# Patient Record
Sex: Female | Born: 1955 | Race: White | Hispanic: No | Smoking: Never smoker
Health system: Southern US, Community
[De-identification: ages and names within clinical notes are randomized; demographics above are authoritative.]

## PROBLEM LIST (undated history)

## (undated) DIAGNOSIS — G4730 Sleep apnea, unspecified: Secondary | ICD-10-CM

## (undated) DIAGNOSIS — M810 Age-related osteoporosis without current pathological fracture: Secondary | ICD-10-CM

## (undated) DIAGNOSIS — M1990 Unspecified osteoarthritis, unspecified site: Secondary | ICD-10-CM

## (undated) DIAGNOSIS — E785 Hyperlipidemia, unspecified: Secondary | ICD-10-CM

## (undated) DIAGNOSIS — H548 Legal blindness, as defined in USA: Secondary | ICD-10-CM

## (undated) DIAGNOSIS — D497 Neoplasm of unspecified behavior of endocrine glands and other parts of nervous system: Secondary | ICD-10-CM

## (undated) DIAGNOSIS — Z87442 Personal history of urinary calculi: Secondary | ICD-10-CM

## (undated) DIAGNOSIS — T8859XA Other complications of anesthesia, initial encounter: Secondary | ICD-10-CM

## (undated) DIAGNOSIS — J189 Pneumonia, unspecified organism: Secondary | ICD-10-CM

## (undated) DIAGNOSIS — G932 Benign intracranial hypertension: Secondary | ICD-10-CM

## (undated) DIAGNOSIS — R51 Headache: Secondary | ICD-10-CM

## (undated) DIAGNOSIS — I10 Essential (primary) hypertension: Secondary | ICD-10-CM

## (undated) DIAGNOSIS — R0602 Shortness of breath: Secondary | ICD-10-CM

## (undated) DIAGNOSIS — D649 Anemia, unspecified: Secondary | ICD-10-CM

## (undated) HISTORY — DX: Hyperlipidemia, unspecified: E78.5

## (undated) HISTORY — PX: EYE SURGERY: SHX253

## (undated) HISTORY — PX: ABDOMINAL HYSTERECTOMY: SHX81

## (undated) HISTORY — PX: CHOLECYSTECTOMY: SHX55

## (undated) HISTORY — PX: GASTRIC BYPASS: SHX52

## (undated) HISTORY — DX: Essential (primary) hypertension: I10

## (undated) HISTORY — PX: HERNIA REPAIR: SHX51

---

## 1998-06-29 ENCOUNTER — Encounter: Admission: RE | Admit: 1998-06-29 | Discharge: 1998-09-27 | Payer: Self-pay | Admitting: Family Medicine

## 2008-01-03 HISTORY — PX: JOINT REPLACEMENT: SHX530

## 2008-07-28 ENCOUNTER — Encounter: Payer: Self-pay | Admitting: Internal Medicine

## 2008-08-21 DIAGNOSIS — J40 Bronchitis, not specified as acute or chronic: Secondary | ICD-10-CM | POA: Insufficient documentation

## 2008-08-21 DIAGNOSIS — I498 Other specified cardiac arrhythmias: Secondary | ICD-10-CM | POA: Insufficient documentation

## 2008-08-24 ENCOUNTER — Ambulatory Visit: Payer: Self-pay | Admitting: Internal Medicine

## 2008-08-24 DIAGNOSIS — R0602 Shortness of breath: Secondary | ICD-10-CM | POA: Insufficient documentation

## 2008-08-24 DIAGNOSIS — I10 Essential (primary) hypertension: Secondary | ICD-10-CM | POA: Insufficient documentation

## 2008-08-24 DIAGNOSIS — R5383 Other fatigue: Secondary | ICD-10-CM

## 2008-08-24 DIAGNOSIS — R5381 Other malaise: Secondary | ICD-10-CM

## 2008-08-26 ENCOUNTER — Telehealth (INDEPENDENT_AMBULATORY_CARE_PROVIDER_SITE_OTHER): Payer: Self-pay

## 2008-08-27 ENCOUNTER — Ambulatory Visit: Payer: Self-pay | Admitting: Internal Medicine

## 2008-08-27 ENCOUNTER — Ambulatory Visit: Payer: Self-pay

## 2008-08-27 ENCOUNTER — Encounter: Payer: Self-pay | Admitting: Internal Medicine

## 2008-08-27 LAB — CONVERTED CEMR LAB: Free T4: 0.7 ng/dL (ref 0.6–1.6)

## 2008-08-28 LAB — CONVERTED CEMR LAB
Basophils Absolute: 0 10*3/uL (ref 0.0–0.1)
Calcium: 9 mg/dL (ref 8.4–10.5)
GFR calc non Af Amer: 93.29 mL/min (ref >60)
HCT: 40.6 % (ref 36.0–46.0)
HDL: 47.2 mg/dL (ref >39.00)
Lymphs Abs: 2 10*3/uL (ref 0.7–4.0)
Monocytes Absolute: 0.6 10*3/uL (ref 0.1–1.0)
Monocytes Relative: 8.8 % (ref 3.0–12.0)
Neutrophils Relative %: 58.4 % (ref 43.0–77.0)
Platelets: 230 10*3/uL (ref 150.0–400.0)
Potassium: 4.2 meq/L (ref 3.5–5.1)
RDW: 13 % (ref 11.5–14.6)
Sodium: 141 meq/L (ref 135–145)
Total CHOL/HDL Ratio: 4
Triglycerides: 103 mg/dL (ref 0.0–149.0)
VLDL: 20.6 mg/dL (ref 0.0–40.0)
WBC: 6.5 10*3/uL (ref 4.5–10.5)

## 2008-09-11 ENCOUNTER — Inpatient Hospital Stay (HOSPITAL_COMMUNITY): Admission: RE | Admit: 2008-09-11 | Discharge: 2008-09-15 | Payer: Self-pay | Admitting: Orthopedic Surgery

## 2009-07-01 ENCOUNTER — Encounter: Admission: RE | Admit: 2009-07-01 | Discharge: 2009-09-29 | Payer: Self-pay | Admitting: Orthopedic Surgery

## 2010-04-08 LAB — URINALYSIS, ROUTINE W REFLEX MICROSCOPIC
Ketones, ur: NEGATIVE mg/dL
Nitrite: NEGATIVE
Protein, ur: NEGATIVE mg/dL
Urobilinogen, UA: 0.2 mg/dL (ref 0.0–1.0)

## 2010-04-08 LAB — PROTIME-INR
INR: 0.9 (ref 0.00–1.49)
INR: 1.2 (ref 0.00–1.49)
INR: 2.4 — ABNORMAL HIGH (ref 0.00–1.49)
INR: 2.6 — ABNORMAL HIGH (ref 0.00–1.49)
Prothrombin Time: 25.9 seconds — ABNORMAL HIGH (ref 11.6–15.2)

## 2010-04-08 LAB — CBC
Hemoglobin: 10.6 g/dL — ABNORMAL LOW (ref 12.0–15.0)
Hemoglobin: 9.7 g/dL — ABNORMAL LOW (ref 12.0–15.0)
Platelets: 157 10*3/uL (ref 150–400)
Platelets: 236 10*3/uL (ref 150–400)
RBC: 3.19 MIL/uL — ABNORMAL LOW (ref 3.87–5.11)
RBC: 3.47 MIL/uL — ABNORMAL LOW (ref 3.87–5.11)
RDW: 13.9 % (ref 11.5–15.5)
RDW: 14.3 % (ref 11.5–15.5)
WBC: 10 10*3/uL (ref 4.0–10.5)

## 2010-04-08 LAB — BASIC METABOLIC PANEL
Calcium: 7.5 mg/dL — ABNORMAL LOW (ref 8.4–10.5)
Calcium: 7.8 mg/dL — ABNORMAL LOW (ref 8.4–10.5)
Creatinine, Ser: 0.73 mg/dL (ref 0.4–1.2)
GFR calc Af Amer: >60 mL/min (ref >60)
GFR calc non Af Amer: >60 mL/min (ref >60)
GFR calc non Af Amer: >60 mL/min (ref >60)
Glucose, Bld: 136 mg/dL — ABNORMAL HIGH (ref 70–99)
Sodium: 134 mEq/L — ABNORMAL LOW (ref 135–145)

## 2010-04-08 LAB — TYPE AND SCREEN: Antibody Screen: NEGATIVE

## 2010-04-08 LAB — URINALYSIS, MICROSCOPIC ONLY
Bilirubin Urine: NEGATIVE
Glucose, UA: NEGATIVE mg/dL
Hgb urine dipstick: NEGATIVE
Protein, ur: NEGATIVE mg/dL
Specific Gravity, Urine: 1.012 (ref 1.005–1.030)
Urobilinogen, UA: 1 mg/dL (ref 0.0–1.0)

## 2010-04-08 LAB — COMPREHENSIVE METABOLIC PANEL
ALT: 18 U/L (ref 0–35)
AST: 18 U/L (ref 0–37)
Albumin: 3.6 g/dL (ref 3.5–5.2)
Alkaline Phosphatase: 64 U/L (ref 39–117)
BUN: 17 mg/dL (ref 6–23)
GFR calc Af Amer: >60 mL/min (ref >60)
Potassium: 3.7 mEq/L (ref 3.5–5.1)
Sodium: 140 mEq/L (ref 135–145)
Total Protein: 6.9 g/dL (ref 6.0–8.3)

## 2010-04-08 LAB — URINE CULTURE: Colony Count: >=100000

## 2012-05-08 ENCOUNTER — Telehealth: Payer: Self-pay | Admitting: Nurse Practitioner

## 2012-05-08 NOTE — Telephone Encounter (Signed)
PATIENT WANTED AN APPT FOR 5/15 FOR RIGHT SIDE PAIN APPT WAS GIVEN BUT PATIENT WAS TOLD AND UNDERSTANDS THAT IF SHE STARTS HAVING ANY FEVER, VOMITING OR IF THE PAIN GOT WORSE SHE NEEDED TO GET TO THE ER. THE 15TH IS WHEN THE PATIENT WANTED TO BE SEEN.

## 2012-05-16 ENCOUNTER — Ambulatory Visit (INDEPENDENT_AMBULATORY_CARE_PROVIDER_SITE_OTHER): Payer: Medicare Other | Admitting: Nurse Practitioner

## 2012-05-16 ENCOUNTER — Ambulatory Visit (INDEPENDENT_AMBULATORY_CARE_PROVIDER_SITE_OTHER): Payer: Medicare Other

## 2012-05-16 ENCOUNTER — Encounter: Payer: Self-pay | Admitting: Nurse Practitioner

## 2012-05-16 VITALS — BP 121/80 | HR 78 | Temp 98.4°F | Wt 237.0 lb

## 2012-05-16 DIAGNOSIS — R5381 Other malaise: Secondary | ICD-10-CM

## 2012-05-16 DIAGNOSIS — I10 Essential (primary) hypertension: Secondary | ICD-10-CM

## 2012-05-16 DIAGNOSIS — R109 Unspecified abdominal pain: Secondary | ICD-10-CM

## 2012-05-16 LAB — COMPLETE METABOLIC PANEL WITH GFR
Albumin: 4 g/dL (ref 3.5–5.2)
Alkaline Phosphatase: 65 U/L (ref 39–117)
BUN: 18 mg/dL (ref 6–23)
GFR, Est Non African American: 85 mL/min
Glucose, Bld: 99 mg/dL (ref 70–99)
Total Bilirubin: 0.5 mg/dL (ref 0.3–1.2)

## 2012-05-16 LAB — THYROID PANEL WITH TSH
Free Thyroxine Index: 2.7 (ref 1.0–3.9)
T3 Uptake: 33.6 % (ref 22.5–37.0)
T4, Total: 7.9 ug/dL (ref 5.0–12.5)

## 2012-05-16 LAB — POCT UA - MICROSCOPIC ONLY
Crystals, Ur, HPF, POC: NEGATIVE
Mucus, UA: NEGATIVE

## 2012-05-16 LAB — POCT URINALYSIS DIPSTICK
Bilirubin, UA: NEGATIVE
Glucose, UA: NEGATIVE
Spec Grav, UA: 1.02

## 2012-05-16 LAB — ANEMIA PANEL 7
%SAT: 28 % (ref 20–55)
Iron: 89 ug/dL (ref 42–145)

## 2012-05-16 NOTE — Progress Notes (Signed)
Subjective:    Patient ID: Rhonda Ruiz, female    DOB: 07-18-1955, 57 y.o.   MRN: [id number]  HPI Patient here today C/O weakness and fatigue. Patient said was having right flank pain fro a week when she called to make appointment.Noe pain has completely resolved. Patient says that she is having trouble sleeping at night and falls asleep during the day if she gets still. She say sthis has been going on for over a year. Patient says that she does snore at night.    Review of Systems  Constitutional: Positive for fatigue and unexpected weight change (gain). Negative for fever, diaphoresis and appetite change.  Respiratory: Negative for cough, shortness of breath and stridor.   Cardiovascular: Negative for chest pain, palpitations and leg swelling.  Gastrointestinal: Positive for diarrhea (since had Gallbladder out.).  Musculoskeletal: Negative.   Skin: Negative.   Hematological: Negative.   Psychiatric/Behavioral: Negative.        Objective:   Physical Exam  Constitutional: She is oriented to person, place, and time. She appears well-developed and well-nourished.  Obese   HENT:  Right Ear: External ear normal.  Left Ear: External ear normal.  Nose: Nose normal.  Mouth/Throat: Oropharynx is clear and moist.  Eyes: EOM are normal. Pupils are equal, round, and reactive to light.  Neck: Normal range of motion. Neck supple.  Cardiovascular: Normal rate, regular rhythm and normal heart sounds.   Pulmonary/Chest: Effort normal and breath sounds normal.  Abdominal: Soft. Bowel sounds are normal.  Musculoskeletal: Normal range of motion.  Lymphadenopathy:    She has no cervical adenopathy.  Neurological: She is alert and oriented to person, place, and time. She has normal reflexes.  Skin: Skin is warm.  Psychiatric: She has a normal mood and affect. Her behavior is normal. Thought content normal.   BP 121/80  Pulse 78  Temp(Src) 98.4 F (36.9 C) (Oral)  Wt 237 lb (107.502 kg)   BMI 49.55 kg/m2 KUB-No acute findings-Preliminary reading by Paulene Floor, FNP  [facility]  Results for orders placed in visit on 05/16/12  POCT URINALYSIS DIPSTICK      Result Value Range   Color, UA gold     Clarity, UA clear     Glucose, UA neg     Bilirubin, UA neg     Ketones, UA neg     Spec Grav, UA 1.020     Blood, UA trace     pH, UA 6.0     Protein, UA neg     Urobilinogen, UA negative     Nitrite, UA neg     Leukocytes, UA Trace    POCT UA - MICROSCOPIC ONLY      Result Value Range   WBC, Ur, HPF, POC 10-15     RBC, urine, microscopic 1-5     Bacteria, U Microscopic mod     Mucus, UA neg     Epithelial cells, urine per micros few     Crystals, Ur, HPF, POC neg     Casts, Ur, LPF, POC neg     Yeast, UA neg           Assessment & Plan:  1. Side pain Keep diary of episodes - POCT urinalysis dipstick - POCT UA - Microscopic Only - DG Abd 1 View; Future  2. Other malaise and fatigue Labs pending Rest - Anemia panel 7 - Ambulatory referral to Sleep Studies - Thyroid Panel With TSH  3. Hypertension Low Na+ diet Low  fat diet and exercise - COMPLETE METABOLIC PANEL WITH GFR  Mary-Margaret Daphine Deutscher, FNP  - NMR Lipoprofile with Lipids

## 2012-05-16 NOTE — Patient Instructions (Signed)
Sleep Apnea  Sleep apnea is a sleep disorder characterized by abnormal pauses in breathing while you sleep. When your breathing pauses, the level of oxygen in your blood decreases. This causes you to move out of deep sleep and into light sleep. As a result, your quality of sleep is poor, and the system that carries your blood throughout your body (cardiovascular system) experiences stress. If sleep apnea remains untreated, the following conditions can develop:  High blood pressure (hypertension).  Coronary artery disease.  Inability to achieve or maintain an erection (impotence).  Impairment of your thought process (cognitive dysfunction). There are three types of sleep apnea: 1. Obstructive sleep apnea Pauses in breathing during sleep because of a blocked airway. 2. Central sleep apnea Pauses in breathing during sleep because the area of the brain that controls your breathing does not send the correct signals to the muscles that control breathing. 3. Mixed sleep apnea A combination of both obstructive and central sleep apnea. RISK FACTORS The following risk factors can increase your risk of developing sleep apnea:  Being overweight.  Smoking.  Having narrow passages in your nose and throat.  Being of older age.  Being female.  Alcohol use.  Sedative and tranquilizer use.  Ethnicity. Among individuals younger than 35 years, African Americans are at increased risk of sleep apnea. SYMPTOMS   Difficulty staying asleep.  Daytime sleepiness and fatigue.  Loss of energy.  Irritability.  Loud, heavy snoring.  Morning headaches.  Trouble concentrating.  Forgetfulness.  Decreased interest in sex. DIAGNOSIS  In order to diagnose sleep apnea, your caregiver will perform a physical examination. Your caregiver may suggest that you take a home sleep test. Your caregiver may also recommend that you spend the night in a sleep lab. In the sleep lab, several monitors record  information about your heart, lungs, and brain while you sleep. Your leg and arm movements and blood oxygen level are also recorded. TREATMENT The following actions may help to resolve mild sleep apnea:  Sleeping on your side.   Using a decongestant if you have nasal congestion.   Avoiding the use of depressants, including alcohol, sedatives, and narcotics.   Losing weight and modifying your diet if you are overweight. There also are devices and treatments to help open your airway:  Oral appliances. These are custom-made mouthpieces that shift your lower jaw forward and slightly open your bite. This opens your airway.  Devices that create positive airway pressure. This positive pressure "splints" your airway open to help you breathe better during sleep. The following devices create positive airway pressure:  Continuous positive airway pressure (CPAP) device. The CPAP device creates a continuous level of air pressure with an air pump. The air is delivered to your airway through a mask while you sleep. This continuous pressure keeps your airway open.  Nasal expiratory positive airway pressure (EPAP) device. The EPAP device creates positive air pressure as you exhale. The device consists of single-use valves, which are inserted into each nostril and held in place by adhesive. The valves create very little resistance when you inhale but create much more resistance when you exhale. That increased resistance creates the positive airway pressure. This positive pressure while you exhale keeps your airway open, making it easier to breath when you inhale again.  Bilevel positive airway pressure (BPAP) device. The BPAP device is used mainly in patients with central sleep apnea. This device is similar to the CPAP device because it also uses an air pump to deliver   continuous air pressure through a mask. However, with the BPAP machine, the pressure is set at two different levels. The pressure when you  exhale is lower than the pressure when you inhale.  Surgery. Typically, surgery is only done if you cannot comply with less invasive treatments or if the less invasive treatments do not improve your condition. Surgery involves removing excess tissue in your airway to create a wider passage way. Document Released: 12/09/2001 Document Revised: 06/20/2011 Document Reviewed: 04/27/2011 ExitCare Patient Information 2013 [employer].  

## 2012-05-20 LAB — NMR LIPOPROFILE WITH LIPIDS
Cholesterol, Total: 203 mg/dL — ABNORMAL HIGH (ref <200)
LDL Particle Number: 2021 nmol/L — ABNORMAL HIGH (ref <1000)
LP-IR Score: 59 — ABNORMAL HIGH (ref <=45)
Large VLDL-P: 2.6 nmol/L (ref <=2.7)
Triglycerides: 175 mg/dL — ABNORMAL HIGH (ref <150)
VLDL Size: 44.8 nm (ref <=46.6)

## 2012-05-21 ENCOUNTER — Telehealth: Payer: Self-pay

## 2012-05-21 ENCOUNTER — Other Ambulatory Visit: Payer: Self-pay | Admitting: Nurse Practitioner

## 2012-05-21 ENCOUNTER — Other Ambulatory Visit: Payer: Self-pay

## 2012-05-21 DIAGNOSIS — G4730 Sleep apnea, unspecified: Secondary | ICD-10-CM

## 2012-05-21 MED ORDER — ATORVASTATIN CALCIUM 40 MG PO TABS: 40.0000 mg | ORAL_TABLET | Freq: Every day | ORAL | Status: DC

## 2012-05-21 NOTE — Telephone Encounter (Signed)
Returning your call. °

## 2012-05-29 ENCOUNTER — Ambulatory Visit: Payer: Medicare Other | Attending: Family Medicine | Admitting: Sleep Medicine

## 2012-05-29 VITALS — Ht 59.0 in | Wt 238.0 lb

## 2012-05-29 DIAGNOSIS — Z6841 Body Mass Index (BMI) 40.0 and over, adult: Secondary | ICD-10-CM | POA: Insufficient documentation

## 2012-05-29 DIAGNOSIS — G4710 Hypersomnia, unspecified: Secondary | ICD-10-CM

## 2012-05-29 DIAGNOSIS — G4733 Obstructive sleep apnea (adult) (pediatric): Secondary | ICD-10-CM | POA: Insufficient documentation

## 2012-06-04 NOTE — Procedures (Signed)
[facility] Tam Delisle A. Gerilyn Pilgrim, MD     [email or web address]        NAME:  Rhonda Ruiz, Rhonda Ruiz                ACCOUNT NO.:  [id number]  MEDICAL RECORD NO.:  [id number]          PATIENT TYPE:  OUT  LOCATION:  SLEEP LAB                     FACILITY:  [facility]  PHYSICIAN:  Terry Abila A. Gerilyn Pilgrim, M.D. DATE OF BIRTH:  February 29, 1955  DATE OF STUDY:  05/29/2012                           NOCTURNAL POLYSOMNOGRAM  REFERRING PHYSICIAN:  MARY-MARGARET MARTIN  INDICATION FOR STUDY:  This is a 57 year old, who presents with snoring loud and difficulty concentrating and daytime sleepiness.  EPWORTH SLEEPINESS SCORE:  14.  BMI:  48.  MEDICATIONS:  Acetazolamide, Lipitor.  SLEEP ARCHITECTURE:  This is a split night recording with initial portion being a diagnostic and the second portion a titration recording. The total recording time is 383 minutes.  Sleep efficiency 80%, sleep latency 10 minutes.  REM latency 173 minutes.  RESPIRATORY DATA:  Baseline oxygen saturation is 98, lowest saturation 89 during non-REM sleep.  Diagnostic AHI is 45.  The patient was placed on positive pressure between 5 and 8.  Optimal pressure is 8 with resolution of obstructive events and with good tolerance.  CARDIAC DATA:  Average heart rate is 83 with no significant dysrhythmias observed.  MOVEMENT-PARASOMNIA:  PLM index 0.  IMPRESSIONS-RECOMMENDATIONS:  Severe obstructive sleep apnea syndrome, which responds well to a CPAP of 8.  Thanks for this referral.    Gerard Cantara A. Gerilyn Pilgrim, M.D.   KAD/MEDQ  D:  06/04/2012 08:57:04  T:  06/04/2012 21:53:50  Job:  [id number]

## 2012-06-10 ENCOUNTER — Telehealth: Payer: Self-pay | Admitting: Nurse Practitioner

## 2012-06-10 ENCOUNTER — Other Ambulatory Visit: Payer: Self-pay | Admitting: Nurse Practitioner

## 2012-06-10 MED ORDER — SIMVASTATIN 40 MG PO TABS: 40.0000 mg | ORAL_TABLET | Freq: Every day | ORAL | Status: DC

## 2012-06-10 NOTE — Telephone Encounter (Signed)
Severe sleep apnea- Needs CPAP- Sleep study place should be ordering for her Changed lipitor to zocor

## 2012-06-10 NOTE — Telephone Encounter (Signed)
This is the one about the cpap machine

## 2012-06-11 NOTE — Telephone Encounter (Signed)
PLEASE TAKE CARE OF THANKS  

## 2012-06-12 ENCOUNTER — Other Ambulatory Visit: Payer: Self-pay | Admitting: Nurse Practitioner

## 2012-06-12 DIAGNOSIS — G4733 Obstructive sleep apnea (adult) (pediatric): Secondary | ICD-10-CM

## 2012-06-12 NOTE — Telephone Encounter (Signed)
I need an order put in EPIc for CPAP, with the pressure rate, will probably print out and then get to me

## 2012-06-12 NOTE — Telephone Encounter (Signed)
This was addressed yesterday- zocor called in and sleep study show ed severe sleep apnea nad they should be calling her for CPAP

## 2012-06-12 NOTE — Telephone Encounter (Signed)
Please see Gina's note.

## 2012-06-12 NOTE — Progress Notes (Signed)
Pt aware.

## 2012-07-19 ENCOUNTER — Telehealth: Payer: Self-pay | Admitting: Nurse Practitioner

## 2012-07-19 NOTE — Telephone Encounter (Signed)
Only one can do is welchol wich is 3 pils two times a day- why can't take statin?

## 2012-07-22 MED ORDER — COLESEVELAM HCL 625 MG PO TABS: 1875.0000 mg | ORAL_TABLET | Freq: Two times a day (BID) | ORAL | Status: DC

## 2012-07-22 NOTE — Telephone Encounter (Signed)
The statins make her joints start hurting. Everything started to ache. Pt is willing to try welchol.

## 2012-10-03 ENCOUNTER — Ambulatory Visit (INDEPENDENT_AMBULATORY_CARE_PROVIDER_SITE_OTHER): Payer: Medicare Other

## 2012-10-03 ENCOUNTER — Other Ambulatory Visit: Payer: Self-pay | Admitting: Orthopedic Surgery

## 2012-10-03 DIAGNOSIS — M25519 Pain in unspecified shoulder: Secondary | ICD-10-CM

## 2012-10-03 DIAGNOSIS — M25569 Pain in unspecified knee: Secondary | ICD-10-CM

## 2012-10-03 DIAGNOSIS — R52 Pain, unspecified: Secondary | ICD-10-CM

## 2012-10-10 ENCOUNTER — Encounter (INDEPENDENT_AMBULATORY_CARE_PROVIDER_SITE_OTHER): Payer: Self-pay

## 2012-10-10 ENCOUNTER — Encounter: Payer: Medicare Other | Admitting: Family Medicine

## 2012-10-15 ENCOUNTER — Telehealth: Payer: Self-pay | Admitting: Nurse Practitioner

## 2012-10-18 ENCOUNTER — Other Ambulatory Visit: Payer: Self-pay | Admitting: General Practice

## 2012-10-21 ENCOUNTER — Other Ambulatory Visit: Payer: Self-pay | Admitting: General Practice

## 2012-10-21 DIAGNOSIS — E782 Mixed hyperlipidemia: Secondary | ICD-10-CM

## 2012-10-21 MED ORDER — GEMFIBROZIL 600 MG PO TABS: 600.0000 mg | ORAL_TABLET | Freq: Two times a day (BID) | ORAL | Status: DC

## 2012-10-21 NOTE — Telephone Encounter (Signed)
Spoke with pt and will sch appt.

## 2012-10-21 NOTE — Telephone Encounter (Signed)
Patient aware that script is ready for pick up. Lopid ordered and welchol discontinued per patient request.

## 2012-10-21 NOTE — Telephone Encounter (Signed)
NTBS for labs prior to changing meds

## 2012-10-22 ENCOUNTER — Telehealth: Payer: Self-pay | Admitting: *Deleted

## 2012-10-22 NOTE — Telephone Encounter (Signed)
Pt aware, gemfibrozil rx ready.

## 2013-01-17 NOTE — Progress Notes (Signed)
Patient ID: Rhonda Ruiz, female   DOB: March 09, 1955, 58 y.o.   MRN: [id number]

## 2013-03-24 ENCOUNTER — Ambulatory Visit: Payer: Medicare Other | Admitting: Physical Therapy

## 2013-03-26 ENCOUNTER — Ambulatory Visit: Payer: Medicare Other | Attending: Orthopedic Surgery | Admitting: Physical Therapy

## 2013-03-26 DIAGNOSIS — M25619 Stiffness of unspecified shoulder, not elsewhere classified: Secondary | ICD-10-CM | POA: Insufficient documentation

## 2013-03-26 DIAGNOSIS — R5381 Other malaise: Secondary | ICD-10-CM | POA: Insufficient documentation

## 2013-03-26 DIAGNOSIS — M25519 Pain in unspecified shoulder: Secondary | ICD-10-CM | POA: Insufficient documentation

## 2013-04-02 ENCOUNTER — Ambulatory Visit: Payer: Medicare Other | Attending: Orthopedic Surgery | Admitting: Physical Therapy

## 2013-04-02 DIAGNOSIS — M25519 Pain in unspecified shoulder: Secondary | ICD-10-CM | POA: Insufficient documentation

## 2013-04-02 DIAGNOSIS — M25619 Stiffness of unspecified shoulder, not elsewhere classified: Secondary | ICD-10-CM | POA: Insufficient documentation

## 2013-04-02 DIAGNOSIS — R5381 Other malaise: Secondary | ICD-10-CM | POA: Insufficient documentation

## 2013-05-15 ENCOUNTER — Encounter: Payer: Self-pay | Admitting: Nurse Practitioner

## 2013-05-15 ENCOUNTER — Ambulatory Visit (INDEPENDENT_AMBULATORY_CARE_PROVIDER_SITE_OTHER): Payer: Medicare Other

## 2013-05-15 ENCOUNTER — Ambulatory Visit (INDEPENDENT_AMBULATORY_CARE_PROVIDER_SITE_OTHER): Payer: Medicare Other | Admitting: Nurse Practitioner

## 2013-05-15 VITALS — BP 98/62 | Temp 98.0°F | Ht 59.0 in | Wt 243.0 lb

## 2013-05-15 DIAGNOSIS — Z23 Encounter for immunization: Secondary | ICD-10-CM

## 2013-05-15 DIAGNOSIS — Z01818 Encounter for other preprocedural examination: Secondary | ICD-10-CM

## 2013-05-15 NOTE — Addendum Note (Signed)
Addended by: Josephine Cables on: 05/15/2013 04:44 PM   Modules accepted: Orders

## 2013-05-15 NOTE — Progress Notes (Signed)
   Subjective:    Patient ID: Rhonda Ruiz, female    DOB: 28-Jun-1955, 58 y.o.   MRN: [id number]  HPI  Patient is getting ready to have left shoulder surgery- SHe is here today for surgical clearance- SHe has no complaints other than her shoulder pain today.    Review of Systems  Constitutional: Negative.   HENT: Negative.   Respiratory: Negative.   Cardiovascular: Negative.   Genitourinary: Negative.   Neurological: Negative.   Psychiatric/Behavioral: Negative.   All other systems reviewed and are negative.      Objective:   Physical Exam  Constitutional: She is oriented to person, place, and time. She appears well-developed and well-nourished.  Cardiovascular: Normal rate, regular rhythm and normal heart sounds.   Pulmonary/Chest: Effort normal and breath sounds normal.  Neurological: She is alert and oriented to person, place, and time.  Skin: Skin is warm and dry.  Psychiatric: She has a normal mood and affect. Her behavior is normal. Judgment and thought content normal.   BP 98/62  Temp(Src) 98 F (36.7 C) (Oral)  Ht 4\' 11"  (1.499 m)  Wt 243 lb (110.224 kg)  BMI 49.05 kg/m2  Chest x ray- negative-Preliminary reading by Ronnald Collum, FNP  [facility]  EKG- NSR with occasional PVC-Rhonda Hassell Done, FNP          Assessment & Plan:   1. Preoperative clearance    Cleared for surgery Will need lab work just prior to surgery  [place], FNP

## 2013-05-22 ENCOUNTER — Encounter: Payer: Self-pay | Admitting: Physician Assistant

## 2013-05-22 ENCOUNTER — Ambulatory Visit (INDEPENDENT_AMBULATORY_CARE_PROVIDER_SITE_OTHER): Payer: Medicare Other | Admitting: Physician Assistant

## 2013-05-22 ENCOUNTER — Encounter (HOSPITAL_COMMUNITY): Payer: Self-pay | Admitting: Pharmacy Technician

## 2013-05-22 VITALS — BP 136/80 | HR 75 | Temp 98.3°F | Ht 59.0 in | Wt 243.0 lb

## 2013-05-22 DIAGNOSIS — L0390 Cellulitis, unspecified: Secondary | ICD-10-CM

## 2013-05-22 DIAGNOSIS — L0291 Cutaneous abscess, unspecified: Secondary | ICD-10-CM

## 2013-05-22 MED ORDER — CEPHALEXIN 500 MG PO CAPS: 500.0000 mg | ORAL_CAPSULE | Freq: Two times a day (BID) | ORAL | Status: DC

## 2013-05-22 NOTE — Progress Notes (Signed)
Subjective:     Patient ID: Rhonda Ruiz, female   DOB: [birth year], 58 y.o.   MRN: [id number]  HPI Pain and redness to site of tetanus injection from 1 week ago Concerned due to upcoming joint replacement next wk  Review of Systems + pain and redness to the R shoulder Denies any drainage from the site No hx of rx to injection    Objective:   Physical Exam + erythema, edema, and induration to the lateral R shoulder Sl TTP No drainage noted FROM of the shoulder    Assessment:     Cellulitis    Plan:     Pt is scheduled for L shoulder replacement next week Will tx with Keflex 500mg  1 po bid # 20] Warm compresses F/U prn

## 2013-05-22 NOTE — Patient Instructions (Signed)
Cellulitis Cellulitis is an infection of the skin and the tissue beneath it. The infected area is usually red and tender. Cellulitis occurs most often in the arms and lower legs.  CAUSES  Cellulitis is caused by bacteria that enter the skin through cracks or cuts in the skin. The most common types of bacteria that cause cellulitis are Staphylococcus and Streptococcus. SYMPTOMS   Redness and warmth.  Swelling.  Tenderness or pain.  Fever. DIAGNOSIS  Your caregiver can usually determine what is wrong based on a physical exam. Blood tests may also be done. TREATMENT  Treatment usually involves taking an antibiotic medicine. HOME CARE INSTRUCTIONS   Take your antibiotics as directed. Finish them even if you start to feel better.  Keep the infected arm or leg elevated to reduce swelling.  Apply a warm cloth to the affected area up to 4 times per day to relieve pain.  Only take over-the-counter or prescription medicines for pain, discomfort, or fever as directed by your caregiver.  Keep all follow-up appointments as directed by your caregiver. SEEK MEDICAL CARE IF:   You notice red streaks coming from the infected area.  Your red area gets larger or turns dark in color.  Your bone or joint underneath the infected area becomes painful after the skin has healed.  Your infection returns in the same area or another area.  You notice a swollen bump in the infected area.  You develop new symptoms. SEEK IMMEDIATE MEDICAL CARE IF:   You have a fever.  You feel very sleepy.  You develop vomiting or diarrhea.  You have a general ill feeling (malaise) with muscle aches and pains. MAKE SURE YOU:   Understand these instructions.  Will watch your condition.  Will get help right away if you are not doing well or get worse. Document Released: 09/28/2004 Document Revised: 06/20/2011 Document Reviewed: 03/06/2011 ExitCare Patient Information 2014 [employer].  

## 2013-05-27 NOTE — Pre-Procedure Instructions (Addendum)
Rhonda Ruiz  05/27/2013   Your procedure is scheduled on:  05/30/13  Report to [facility] short stay admitting at 78 AM.  Call this number if you have problems the morning of surgery: [phone]   Remember:   Do not eat food or drink liquids after midnight.   Take these medicines the morning of surgery with A SIP OF WATER:       Take all meds as ordered until day of surgery except as instructed below or per dr      Bridgette Habermann all herbel meds, nsaids (aleve,naproxen,advil,ibuprofen) including vitamins, aspirin   Do not wear jewelry, make-up or nail polish.  Do not wear lotions, powders, or perfumes. You may wear deodorant.  Do not shave 48 hours prior to surgery. Men may shave face and neck.  Do not bring valuables to the hospital.  [facility] is not responsible                  for any belongings or valuables.               Contacts, dentures or bridgework may not be worn into surgery.  Leave suitcase in the car. After surgery it may be brought to your room.  For patients admitted to the hospital, discharge time is determined by your                treatment team.               Patients discharged the day of surgery will not be allowed to drive  home.  Name and phone number of your driver:   Special Instructions:  Special Instructions: [facility] - Preparing for Surgery  Before surgery, you can play an important role.  Because skin is not sterile, your skin needs to be as free of germs as possible.  You can reduce the number of germs on you skin by washing with CHG (chlorahexidine gluconate) soap before surgery.  CHG is an antiseptic cleaner which kills germs and bonds with the skin to continue killing germs even after washing.  Please DO NOT use if you have an allergy to CHG or antibacterial soaps.  If your skin becomes reddened/irritated stop using the CHG and inform your nurse when you arrive at Short Stay.  Do not shave (including legs and underarms) for at least 48 hours prior  to the first CHG shower.  You may shave your face.  Please follow these instructions carefully:   1.  Shower with CHG Soap the night before surgery and the morning of Surgery.  2.  If you choose to wash your hair, wash your hair first as usual with your normal shampoo.  3.  After you shampoo, rinse your hair and body thoroughly to remove the Shampoo.  4.  Use CHG as you would any other liquid soap.  You can apply chg directly  to the skin and wash gently with scrungie or a clean washcloth.  5.  Apply the CHG Soap to your body ONLY FROM THE NECK DOWN.  Do not use on open wounds or open sores.  Avoid contact with your eyes ears, mouth and genitals (private parts).  Wash genitals (private parts)       with your normal soap.  6.  Wash thoroughly, paying special attention to the area where your surgery will be performed.  7.  Thoroughly rinse your body with warm water from the neck down.  8.  DO NOT shower/wash  with your normal soap after using and rinsing off the CHG Soap.  9.  Pat yourself dry with a clean towel.            10.  Wear clean pajamas.            11.  Place clean sheets on your bed the night of your first shower and do not sleep with pets.  Day of Surgery  Do not apply any lotions/deodorants the morning of surgery.  Please wear clean clothes to the hospital/surgery center.   Please read over the following fact sheets that you were given: Pain Booklet, Coughing and Deep Breathing, Blood Transfusion Information and Surgical Site Infection Prevention

## 2013-05-28 ENCOUNTER — Encounter (HOSPITAL_COMMUNITY): Payer: Self-pay

## 2013-05-28 ENCOUNTER — Encounter (HOSPITAL_COMMUNITY)
Admission: RE | Admit: 2013-05-28 | Discharge: 2013-05-28 | Disposition: A | Discharge status: Home / Self Care | Payer: Medicare Other | Source: Ambulatory Visit | Attending: Orthopedic Surgery | Admitting: Orthopedic Surgery

## 2013-05-28 HISTORY — DX: Shortness of breath: R06.02

## 2013-05-28 HISTORY — DX: Pneumonia, unspecified organism: J18.9

## 2013-05-28 HISTORY — DX: Unspecified osteoarthritis, unspecified site: M19.90

## 2013-05-28 HISTORY — DX: Sleep apnea, unspecified: G47.30

## 2013-05-28 HISTORY — DX: Anemia, unspecified: D64.9

## 2013-05-28 HISTORY — DX: Neoplasm of unspecified behavior of endocrine glands and other parts of nervous system: D49.7

## 2013-05-28 LAB — BASIC METABOLIC PANEL
BUN: 20 mg/dL (ref 6–23)
CHLORIDE: 109 meq/L (ref 96–112)
CO2: 19 meq/L (ref 19–32)
Calcium: 9.2 mg/dL (ref 8.4–10.5)
Creatinine, Ser: 0.7 mg/dL (ref 0.50–1.10)
GFR calc Af Amer: >90 mL/min (ref >90)
GFR calc non Af Amer: >90 mL/min (ref >90)
GLUCOSE: 113 mg/dL — AB (ref 70–99)
Potassium: 5 mEq/L (ref 3.7–5.3)
SODIUM: 141 meq/L (ref 137–147)

## 2013-05-28 LAB — CBC
HEMATOCRIT: 38.5 % (ref 36.0–46.0)
HEMOGLOBIN: 13.4 g/dL (ref 12.0–15.0)
MCH: 31.1 pg (ref 26.0–34.0)
MCHC: 34.8 g/dL (ref 30.0–36.0)
MCV: 89.3 fL (ref 78.0–100.0)
Platelets: 261 10*3/uL (ref 150–400)
RBC: 4.31 MIL/uL (ref 3.87–5.11)
RDW: 13.3 % (ref 11.5–15.5)
WBC: 6.3 10*3/uL (ref 4.0–10.5)

## 2013-05-28 LAB — TYPE AND SCREEN
ABO/RH(D): A POS
Antibody Screen: NEGATIVE

## 2013-05-28 LAB — ABO/RH: ABO/RH(D): A POS

## 2013-05-28 NOTE — Progress Notes (Signed)
No orders at preadmit. Office called by Jacob Moores -sec

## 2013-05-30 ENCOUNTER — Inpatient Hospital Stay (HOSPITAL_COMMUNITY)
Admission: RE | Admit: 2013-05-30 | Discharge: 2013-05-31 | DRG: 483 | Disposition: A | Discharge status: Home Health | Payer: Medicare Other | Source: Ambulatory Visit | Attending: Orthopedic Surgery | Admitting: Orthopedic Surgery

## 2013-05-30 ENCOUNTER — Inpatient Hospital Stay (HOSPITAL_COMMUNITY): Payer: Medicare Other | Admitting: Anesthesiology

## 2013-05-30 ENCOUNTER — Encounter (HOSPITAL_COMMUNITY): Payer: Self-pay | Admitting: *Deleted

## 2013-05-30 ENCOUNTER — Inpatient Hospital Stay (HOSPITAL_COMMUNITY): Payer: Medicare Other

## 2013-05-30 ENCOUNTER — Encounter (HOSPITAL_COMMUNITY)
Admission: RE | Disposition: A | Discharge status: Home Health | Payer: Self-pay | Source: Ambulatory Visit | Attending: Orthopedic Surgery

## 2013-05-30 ENCOUNTER — Encounter (HOSPITAL_COMMUNITY): Payer: Medicare Other | Admitting: Anesthesiology

## 2013-05-30 DIAGNOSIS — H548 Legal blindness, as defined in USA: Secondary | ICD-10-CM | POA: Diagnosis present

## 2013-05-30 DIAGNOSIS — M19019 Primary osteoarthritis, unspecified shoulder: Principal | ICD-10-CM | POA: Diagnosis present

## 2013-05-30 DIAGNOSIS — S43429A Sprain of unspecified rotator cuff capsule, initial encounter: Secondary | ICD-10-CM | POA: Diagnosis present

## 2013-05-30 DIAGNOSIS — G4730 Sleep apnea, unspecified: Secondary | ICD-10-CM | POA: Diagnosis present

## 2013-05-30 DIAGNOSIS — Z888 Allergy status to other drugs, medicaments and biological substances status: Secondary | ICD-10-CM

## 2013-05-30 DIAGNOSIS — Z96649 Presence of unspecified artificial hip joint: Secondary | ICD-10-CM

## 2013-05-30 DIAGNOSIS — E785 Hyperlipidemia, unspecified: Secondary | ICD-10-CM | POA: Diagnosis present

## 2013-05-30 HISTORY — PX: REVERSE SHOULDER ARTHROPLASTY: SHX5054

## 2013-05-30 HISTORY — DX: Benign intracranial hypertension: G93.2

## 2013-05-30 HISTORY — DX: Headache: R51

## 2013-05-30 SURGERY — ARTHROPLASTY, SHOULDER, TOTAL, REVERSE
Anesthesia: General | Site: Shoulder | Laterality: Left

## 2013-05-30 MED ORDER — ONDANSETRON HCL 4 MG/2ML IJ SOLN
INTRAMUSCULAR | Status: DC | PRN
Start: 2013-05-30 — End: 2013-05-30
  Administered 2013-05-30: 4 mg via INTRAVENOUS

## 2013-05-30 MED ORDER — OXYCODONE HCL 5 MG PO TABS
ORAL_TABLET | ORAL | Status: AC
  Filled 2013-05-30: qty 2

## 2013-05-30 MED ORDER — METHOCARBAMOL 1000 MG/10ML IJ SOLN
500.0000 mg | Freq: Four times a day (QID) | INTRAVENOUS | Status: DC | PRN
  Filled 2013-05-30 (×2): qty 5

## 2013-05-30 MED ORDER — METOCLOPRAMIDE HCL 10 MG PO TABS: 5.0000 mg | ORAL_TABLET | Freq: Three times a day (TID) | ORAL | Status: DC | PRN

## 2013-05-30 MED ORDER — ROCURONIUM BROMIDE 100 MG/10ML IV SOLN
INTRAVENOUS | Status: DC | PRN
Start: 2013-05-30 — End: 2013-05-30
  Administered 2013-05-30: 50 mg via INTRAVENOUS

## 2013-05-30 MED ORDER — NEOSTIGMINE METHYLSULFATE 10 MG/10ML IV SOLN
INTRAVENOUS | Status: DC | PRN
  Administered 2013-05-30: 3 mg via INTRAVENOUS

## 2013-05-30 MED ORDER — SODIUM CHLORIDE 0.9 % IR SOLN
Status: DC | PRN
  Administered 2013-05-30: 1000 mL

## 2013-05-30 MED ORDER — OXYCODONE-ACETAMINOPHEN 5-325 MG PO TABS: 1.0000 | ORAL_TABLET | ORAL | Status: DC | PRN

## 2013-05-30 MED ORDER — ACETAMINOPHEN 650 MG RE SUPP: 650.0000 mg | Freq: Four times a day (QID) | RECTAL | Status: DC | PRN

## 2013-05-30 MED ORDER — LIDOCAINE HCL (CARDIAC) 20 MG/ML IV SOLN
INTRAVENOUS | Status: DC | PRN
  Administered 2013-05-30: 75 mg via INTRAVENOUS

## 2013-05-30 MED ORDER — METHOCARBAMOL 500 MG PO TABS
500.0000 mg | ORAL_TABLET | Freq: Four times a day (QID) | ORAL | Status: DC | PRN
  Administered 2013-05-30 – 2013-05-31 (×3): 500 mg via ORAL
  Filled 2013-05-30 (×2): qty 1

## 2013-05-30 MED ORDER — HYDROMORPHONE HCL PF 1 MG/ML IJ SOLN
INTRAMUSCULAR | Status: AC
  Filled 2013-05-30: qty 1

## 2013-05-30 MED ORDER — ONDANSETRON HCL 4 MG/2ML IJ SOLN
INTRAMUSCULAR | Status: AC
  Filled 2013-05-30: qty 2

## 2013-05-30 MED ORDER — CHLORHEXIDINE GLUCONATE 4 % EX LIQD: 60.0000 mL | Freq: Once | CUTANEOUS | Status: DC

## 2013-05-30 MED ORDER — OXYCODONE HCL 5 MG PO TABS: 5.0000 mg | ORAL_TABLET | Freq: Once | ORAL | Status: DC | PRN

## 2013-05-30 MED ORDER — ROCURONIUM BROMIDE 50 MG/5ML IV SOLN
INTRAVENOUS | Status: AC
  Filled 2013-05-30: qty 1

## 2013-05-30 MED ORDER — MENTHOL 3 MG MT LOZG: 1.0000 | LOZENGE | OROMUCOSAL | Status: DC | PRN

## 2013-05-30 MED ORDER — CEFAZOLIN SODIUM-DEXTROSE 2-3 GM-% IV SOLR
2.0000 g | INTRAVENOUS | Status: AC
  Administered 2013-05-30: 2 g via INTRAVENOUS

## 2013-05-30 MED ORDER — METHOCARBAMOL 500 MG PO TABS: 500.0000 mg | ORAL_TABLET | Freq: Three times a day (TID) | ORAL | Status: DC | PRN

## 2013-05-30 MED ORDER — CEFAZOLIN SODIUM-DEXTROSE 2-3 GM-% IV SOLR
2.0000 g | Freq: Four times a day (QID) | INTRAVENOUS | Status: AC
  Administered 2013-05-30 – 2013-05-31 (×3): 2 g via INTRAVENOUS
  Filled 2013-05-30 (×3): qty 50

## 2013-05-30 MED ORDER — ONDANSETRON HCL 4 MG/2ML IJ SOLN
4.0000 mg | Freq: Four times a day (QID) | INTRAMUSCULAR | Status: DC | PRN
  Administered 2013-05-30: 4 mg via INTRAVENOUS
  Filled 2013-05-30 (×2): qty 2

## 2013-05-30 MED ORDER — GEMFIBROZIL 600 MG PO TABS
600.0000 mg | ORAL_TABLET | Freq: Two times a day (BID) | ORAL | Status: DC
  Administered 2013-05-30 – 2013-05-31 (×2): 600 mg via ORAL
  Filled 2013-05-30 (×4): qty 1

## 2013-05-30 MED ORDER — METHOCARBAMOL 500 MG PO TABS
ORAL_TABLET | ORAL | Status: AC
  Filled 2013-05-30: qty 1

## 2013-05-30 MED ORDER — SODIUM CHLORIDE 0.9 % IV SOLN: INTRAVENOUS | Status: DC

## 2013-05-30 MED ORDER — BUPIVACAINE-EPINEPHRINE (PF) 0.25% -1:200000 IJ SOLN
INTRAMUSCULAR | Status: AC
  Filled 2013-05-30: qty 30

## 2013-05-30 MED ORDER — FENTANYL CITRATE 0.05 MG/ML IJ SOLN
INTRAMUSCULAR | Status: AC
Start: 2013-05-30 — End: 2013-05-30
  Filled 2013-05-30: qty 5

## 2013-05-30 MED ORDER — ONDANSETRON HCL 4 MG/2ML IJ SOLN: 4.0000 mg | Freq: Once | INTRAMUSCULAR | Status: DC | PRN

## 2013-05-30 MED ORDER — MIDAZOLAM HCL 2 MG/2ML IJ SOLN
INTRAMUSCULAR | Status: AC
  Administered 2013-05-30: 2 mg via INTRAVENOUS
  Filled 2013-05-30: qty 2

## 2013-05-30 MED ORDER — PROPOFOL 10 MG/ML IV BOLUS
INTRAVENOUS | Status: DC | PRN
  Administered 2013-05-30: 200 mg via INTRAVENOUS

## 2013-05-30 MED ORDER — FENTANYL CITRATE 0.05 MG/ML IJ SOLN
50.0000 ug | INTRAMUSCULAR | Status: DC | PRN
  Administered 2013-05-30: 100 ug via INTRAVENOUS

## 2013-05-30 MED ORDER — BUPIVACAINE-EPINEPHRINE 0.25% -1:200000 IJ SOLN
INTRAMUSCULAR | Status: DC | PRN
  Administered 2013-05-30: 10 mL

## 2013-05-30 MED ORDER — MIDAZOLAM HCL 2 MG/2ML IJ SOLN
1.0000 mg | INTRAMUSCULAR | Status: DC | PRN
  Administered 2013-05-30: 2 mg via INTRAVENOUS

## 2013-05-30 MED ORDER — FENTANYL CITRATE 0.05 MG/ML IJ SOLN
INTRAMUSCULAR | Status: AC
  Administered 2013-05-30: 100 ug via INTRAVENOUS
  Filled 2013-05-30: qty 2

## 2013-05-30 MED ORDER — GLYCOPYRROLATE 0.2 MG/ML IJ SOLN
INTRAMUSCULAR | Status: DC | PRN
Start: 2013-05-30 — End: 2013-05-30
  Administered 2013-05-30: 0.4 mg via INTRAVENOUS

## 2013-05-30 MED ORDER — ACETAZOLAMIDE 250 MG PO TABS
500.0000 mg | ORAL_TABLET | Freq: Two times a day (BID) | ORAL | Status: DC
  Administered 2013-05-30 – 2013-05-31 (×3): 500 mg via ORAL
  Filled 2013-05-30 (×4): qty 2

## 2013-05-30 MED ORDER — HYDROMORPHONE HCL PF 1 MG/ML IJ SOLN
0.2500 mg | INTRAMUSCULAR | Status: DC | PRN
  Administered 2013-05-30: 1 mg via INTRAVENOUS

## 2013-05-30 MED ORDER — METOCLOPRAMIDE HCL 5 MG/ML IJ SOLN: 5.0000 mg | Freq: Three times a day (TID) | INTRAMUSCULAR | Status: DC | PRN

## 2013-05-30 MED ORDER — PROPOFOL 10 MG/ML IV BOLUS
INTRAVENOUS | Status: AC
  Filled 2013-05-30: qty 20

## 2013-05-30 MED ORDER — ONDANSETRON HCL 4 MG PO TABS
4.0000 mg | ORAL_TABLET | Freq: Four times a day (QID) | ORAL | Status: DC | PRN
  Filled 2013-05-30: qty 1

## 2013-05-30 MED ORDER — ACETAMINOPHEN 325 MG PO TABS: 650.0000 mg | ORAL_TABLET | Freq: Four times a day (QID) | ORAL | Status: DC | PRN

## 2013-05-30 MED ORDER — MORPHINE SULFATE 2 MG/ML IJ SOLN
2.0000 mg | INTRAMUSCULAR | Status: DC | PRN
  Administered 2013-05-31: 2 mg via INTRAVENOUS
  Filled 2013-05-30: qty 1

## 2013-05-30 MED ORDER — LACTATED RINGERS IV SOLN
INTRAVENOUS | Status: DC
  Administered 2013-05-30: 09:00:00 via INTRAVENOUS

## 2013-05-30 MED ORDER — OXYCODONE HCL 5 MG PO TABS
5.0000 mg | ORAL_TABLET | ORAL | Status: DC | PRN
  Administered 2013-05-30 – 2013-05-31 (×7): 10 mg via ORAL
  Filled 2013-05-30 (×6): qty 2

## 2013-05-30 MED ORDER — FENTANYL CITRATE 0.05 MG/ML IJ SOLN
INTRAMUSCULAR | Status: DC | PRN
  Administered 2013-05-30: 100 ug via INTRAVENOUS

## 2013-05-30 MED ORDER — LIDOCAINE HCL (CARDIAC) 20 MG/ML IV SOLN
INTRAVENOUS | Status: AC
  Filled 2013-05-30: qty 5

## 2013-05-30 MED ORDER — OXYCODONE HCL 5 MG/5ML PO SOLN: 5.0000 mg | Freq: Once | ORAL | Status: DC | PRN

## 2013-05-30 MED ORDER — SODIUM CHLORIDE 0.9 % IV SOLN
10.0000 mg | INTRAVENOUS | Status: DC | PRN
  Administered 2013-05-30: 15 ug/min via INTRAVENOUS

## 2013-05-30 MED ORDER — PHENOL 1.4 % MT LIQD: 1.0000 | OROMUCOSAL | Status: DC | PRN

## 2013-05-30 SURGICAL SUPPLY — 68 items
BIT DRILL 170X2.5X (BIT) IMPLANT
BIT DRILL QC 3.3X248 (BIT) ×2 IMPLANT
BIT DRL 170X2.5X (BIT)
BLADE SAG 18X100X1.27 (BLADE) ×3 IMPLANT
CAPT SHOULD DELTAXTEND CEM MOD ×2 IMPLANT
CEMENT BONE DEPUY (Cement) ×2 IMPLANT
CLOSURE WOUND 1/2 X4 (GAUZE/BANDAGES/DRESSINGS) ×1
COVER SURGICAL LIGHT HANDLE (MISCELLANEOUS) ×3 IMPLANT
DRAPE INCISE IOBAN 66X45 STRL (DRAPES) ×7 IMPLANT
DRAPE U-SHAPE 47X51 STRL (DRAPES) ×3 IMPLANT
DRAPE X-RAY CASS 24X20 (DRAPES) IMPLANT
DRILL 2.5 (BIT)
DRSG ADAPTIC 3X8 NADH LF (GAUZE/BANDAGES/DRESSINGS) ×3 IMPLANT
DRSG PAD ABDOMINAL 8X10 ST (GAUZE/BANDAGES/DRESSINGS) ×3 IMPLANT
DURAPREP 26ML APPLICATOR (WOUND CARE) ×3 IMPLANT
ELECT BLADE 4.0 EZ CLEAN MEGAD (MISCELLANEOUS) ×3
ELECT NDL TIP 2.8 STRL (NEEDLE) ×1 IMPLANT
ELECT NEEDLE TIP 2.8 STRL (NEEDLE) ×3 IMPLANT
ELECT REM PT RETURN 9FT ADLT (ELECTROSURGICAL) ×3
ELECTRODE BLDE 4.0 EZ CLN MEGD (MISCELLANEOUS) ×1 IMPLANT
ELECTRODE REM PT RTRN 9FT ADLT (ELECTROSURGICAL) ×1 IMPLANT
GLOVE BIOGEL PI ORTHO PRO 7.5 (GLOVE) ×2
GLOVE BIOGEL PI ORTHO PRO SZ8 (GLOVE) ×2
GLOVE ORTHO TXT STRL SZ7.5 (GLOVE) ×3 IMPLANT
GLOVE PI ORTHO PRO STRL 7.5 (GLOVE) ×1 IMPLANT
GLOVE PI ORTHO PRO STRL SZ8 (GLOVE) ×1 IMPLANT
GLOVE SURG ORTHO 8.5 STRL (GLOVE) ×3 IMPLANT
GOWN STRL REUS W/ TWL LRG LVL3 (GOWN DISPOSABLE) ×1 IMPLANT
GOWN STRL REUS W/ TWL XL LVL3 (GOWN DISPOSABLE) ×2 IMPLANT
GOWN STRL REUS W/TWL LRG LVL3 (GOWN DISPOSABLE) ×3
GOWN STRL REUS W/TWL XL LVL3 (GOWN DISPOSABLE) ×6
HANDPIECE INTERPULSE COAX TIP (DISPOSABLE)
KIT BASIN OR (CUSTOM PROCEDURE TRAY) ×3 IMPLANT
KIT ROOM TURNOVER OR (KITS) ×3 IMPLANT
MANIFOLD NEPTUNE II (INSTRUMENTS) ×3 IMPLANT
NDL 1/2 CIR MAYO (NEEDLE) ×1 IMPLANT
NDL HYPO 25GX1X1/2 BEV (NEEDLE) ×1 IMPLANT
NEEDLE 1/2 CIR MAYO (NEEDLE) ×3 IMPLANT
NEEDLE HYPO 25GX1X1/2 BEV (NEEDLE) ×3 IMPLANT
NS IRRIG 1000ML POUR BTL (IV SOLUTION) ×3 IMPLANT
PACK SHOULDER (CUSTOM PROCEDURE TRAY) ×3 IMPLANT
PAD ABD 8X10 STRL (GAUZE/BANDAGES/DRESSINGS) ×2 IMPLANT
PAD ARMBOARD 7.5X6 YLW CONV (MISCELLANEOUS) ×6 IMPLANT
PIN GUIDE 1.2 (PIN) IMPLANT
PIN GUIDE GLENOPHERE 1.5MX300M (PIN) IMPLANT
PIN METAGLENE 2.5 (PIN) IMPLANT
SET HNDPC FAN SPRY TIP SCT (DISPOSABLE) IMPLANT
SLING ARM LRG ADULT FOAM STRAP (SOFTGOODS) IMPLANT
SLING ARM MED ADULT FOAM STRAP (SOFTGOODS) IMPLANT
SPONGE GAUZE 4X4 12PLY (GAUZE/BANDAGES/DRESSINGS) ×3 IMPLANT
SPONGE GAUZE 4X4 12PLY STER LF (GAUZE/BANDAGES/DRESSINGS) ×2 IMPLANT
SPONGE LAP 18X18 X RAY DECT (DISPOSABLE) ×3 IMPLANT
SPONGE LAP 4X18 X RAY DECT (DISPOSABLE) ×3 IMPLANT
STRIP CLOSURE SKIN 1/2X4 (GAUZE/BANDAGES/DRESSINGS) ×2 IMPLANT
SUCTION FRAZIER TIP 10 FR DISP (SUCTIONS) ×3 IMPLANT
SUT FIBERWIRE #2 38 T-5 BLUE (SUTURE) ×9
SUT MNCRL AB 4-0 PS2 18 (SUTURE) ×3 IMPLANT
SUT VIC AB 2-0 CT1 27 (SUTURE) ×3
SUT VIC AB 2-0 CT1 TAPERPNT 27 (SUTURE) ×1 IMPLANT
SUT VICRYL 0 CT 1 36IN (SUTURE) ×3 IMPLANT
SUTURE FIBERWR #2 38 T-5 BLUE (SUTURE) ×2 IMPLANT
SYR CONTROL 10ML LL (SYRINGE) ×3 IMPLANT
TOWEL OR 17X24 6PK STRL BLUE (TOWEL DISPOSABLE) ×3 IMPLANT
TOWEL OR 17X26 10 PK STRL BLUE (TOWEL DISPOSABLE) ×3 IMPLANT
TOWER CARTRIDGE SMART MIX (DISPOSABLE) IMPLANT
TRAY FOLEY CATH 16FRSI W/METER (SET/KITS/TRAYS/PACK) ×1 IMPLANT
WATER STERILE IRR 1000ML POUR (IV SOLUTION) ×3 IMPLANT
YANKAUER SUCT BULB TIP NO VENT (SUCTIONS) ×4 IMPLANT

## 2013-05-30 NOTE — H&P (Signed)
Rhonda Ruiz is an 58 y.o. female.    Chief Complaint: left shoulder pain  HPI: Pt is a 58 y.o. female complaining of left shoulder pain for multiple years. Pain had continually increased since the beginning. 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 Pt has tried various conservative treatments which have failed to alleviate their symptoms, including therapy and injections. Various options are discussed with the patient. Risks, benefits and expectations were discussed with the patient. Patient understand the risks, benefits and expectations and wishes to proceed with surgery.   PCP:  Redge Gainer, MD  D/C Plans: Home with HHPT  PMH: Past Medical History  Diagnosis Date  . Hyperlipidemia   . 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 Sleep apnea     cpap 1-2 yrs  . Shortness of breath   . Pneumonia     hx  . Arthritis   . Anemia     hx-before hysterectomy    PSH: Past Surgical History  Procedure Laterality Date  . Joint replacement Right 10    hip  . Abdominal hysterectomy    . Cesarean section      x2  . Cholecystectomy    . Hernia repair      abd hernia  . Eye surgery Bilateral     optic sheath    Social History:  reports that she has never smoked. She does not have any smokeless tobacco history on file. She reports that she does not drink alcohol or use illicit drugs.  Allergies:  Allergies  Allergen Reactions  . Prednisone     ALL STEROIDS  . Tape Other (See Comments)    Blisters-white/clear  . Tetracyclines & Related     Medications: Current Facility-Administered Medications  Medication Dose Route Frequency Provider Last Rate Last Dose  . ceFAZolin (ANCEF) IVPB 2 g/50 mL premix  2 g Intravenous On Call to [place], PA-C      . chlorhexidine (HIBICLENS) 4 % liquid 4 application  60 mL Topical Once Ventura Bruns, PA-C      . lactated ringers infusion   Intravenous Continuous Napoleon Form, MD        Results for orders placed during the hospital encounter of 05/28/13 (from the past 48 hour(s))  BASIC METABOLIC PANEL     Status: Abnormal   Collection Time    05/28/13  9:27 AM      Result Value Ref Range   Sodium 141  137 - 147 mEq/L   Potassium 5.0  3.7 - 5.3 mEq/L   Comment: HEMOLYSIS AT THIS LEVEL MAY AFFECT RESULT   Chloride 109  96 - 112 mEq/L   CO2 19  19 - 32 mEq/L   Glucose, Bld 113 (*) 70 - 99 mg/dL   BUN 20  6 - 23 mg/dL   Creatinine, Ser 0.70  0.50 - 1.10 mg/dL   Calcium 9.2  8.4 - 10.5 mg/dL   GFR calc non Af Amer >90  >90 mL/min   GFR calc Af Amer >90  >90 mL/min   Comment: (NOTE)     The eGFR has been calculated using the CKD EPI equation.     This calculation has not been validated in all clinical situations.     eGFR's persistently <90 mL/min signify possible Chronic Kidney     Disease.  CBC     Status: None   Collection  Time    05/28/13  9:27 AM      Result Value Ref Range   WBC 6.3  4.0 - 10.5 K/uL   Comment: WHITE COUNT CONFIRMED ON SMEAR   RBC 4.31  3.87 - 5.11 MIL/uL   Hemoglobin 13.4  12.0 - 15.0 g/dL   HCT 38.5  36.0 - 46.0 %   MCV 89.3  78.0 - 100.0 fL   MCH 31.1  26.0 - 34.0 pg   MCHC 34.8  30.0 - 36.0 g/dL   RDW 13.3  11.5 - 15.5 %   Platelets 261  150 - 400 K/uL  TYPE AND SCREEN     Status: None   Collection Time    05/28/13  9:40 AM      Result Value Ref Range   ABO/RH(D) A POS     Antibody Screen NEG     Sample Expiration 05/31/2013    ABO/RH     Status: None   Collection Time    05/28/13  9:40 AM      Result Value Ref Range   ABO/RH(D) A POS     No results found.  ROS: Pain with rom of the left upper extremity  Physical Exam:  Alert and oriented 58 y.o. female in no acute distress Cranial nerves 2-12 intact Cervical spine: full rom with no tenderness, nv intact distally Chest: active breath sounds bilaterally, no wheeze rhonchi or rales Heart: regular rate and rhythm, no murmur Abd: non tender non  distended with active bowel sounds Hip is stable with rom  Left shoulder with moderate crepitus with rom Moderate weakness with ER and IR No rashes or edema  Assessment/Plan Assessment: left shoulder rotator cuff insufficiency   Plan: Patient will undergo a left shoulder reverse total by Dr. Veverly Fells at [facility]. Risks benefits and expectations were discussed with the patient. Patient understand risks, benefits and expectations and wishes to proceed.

## 2013-05-30 NOTE — Discharge Instructions (Signed)
Ice to the shoulder constantly.  Move the shoulder a lot but do not lift push or pull with the arm.  Use sling out of the home, ok to remove while seated in the house.  Keep incision clean and dry for one week, then shower.  Follow up with Dr Veverly Fells in 2 weeks  [phone]

## 2013-05-30 NOTE — Progress Notes (Signed)
Skin tears/ small inner aspect l UE  Found after pt c/o a burning sensation that area/ non draing , covered with tegaderm

## 2013-05-30 NOTE — Anesthesia Postprocedure Evaluation (Signed)
  Anesthesia Post-op Note  Patient: Rhonda Ruiz  Procedure(s) Performed: Procedure(s): LEFT REVERSE SHOULDER ARTHROPLASTY (Left)  Patient Location: PACU  Anesthesia Type:GA combined with regional for post-op pain  Level of Consciousness: awake, alert  and oriented  Airway and Oxygen Therapy: Patient Spontanous Breathing and Patient connected to nasal cannula oxygen  Post-op Pain: mild  Post-op Assessment: Post-op Vital signs reviewed  Post-op Vital Signs: Reviewed  Last Vitals:  Filed Vitals:   05/30/13 1300  BP: 144/79  Pulse: 57  Temp:   Resp: 18    Complications: No apparent anesthesia complications

## 2013-05-30 NOTE — Interval H&P Note (Signed)
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 After consideration of risks, benefits and other options for treatment, the patient has consented to  Procedure(s): LEFT REVERSE SHOULDER ARTHROPLASTY (Left) as a surgical intervention .  The patient's history has been reviewed, patient examined, no change in status, stable for surgery.  I have reviewed the patient's chart and labs.  Questions were answered to the patient's satisfaction.     [name]

## 2013-05-30 NOTE — Anesthesia Procedure Notes (Addendum)
Anesthesia Regional Block:  Interscalene brachial plexus block  Pre-Anesthetic Checklist: ,, timeout performed, Correct Patient, Correct Site, Correct Laterality, Correct Procedure, Correct Position, site marked, Risks and benefits discussed,  Surgical consent,  Pre-op evaluation,  At surgeon's request and post-op pain management  Laterality: Left and Upper  Prep: chloraprep       Needles:  Injection technique: Single-shot  Needle Type: Echogenic Needle     Needle Length: 5cm 5 cm Needle Gauge: 21 and 21 G    Additional Needles:  Procedures: ultrasound guided (picture in chart) Interscalene brachial plexus block Narrative:  Start time: 05/30/2013 9:21 AM End time: 05/30/2013 9:29 AM Injection made incrementally with aspirations every 5 mL.  Performed by: Personally  Anesthesiologist: Lorrene Reid, MD   Procedure Name: Intubation Date/Time: 05/30/2013 10:13 AM Performed by: Kyung Rudd Pre-anesthesia Checklist: Patient identified, Emergency Drugs available, Suction available, Patient being monitored and Timeout performed Patient Re-evaluated:Patient Re-evaluated prior to inductionOxygen Delivery Method: Circle system utilized Preoxygenation: Pre-oxygenation with 100% oxygen Intubation Type: IV induction Ventilation: Mask ventilation without difficulty Laryngoscope Size: Mac and 3 Grade View: Grade I Tube type: Oral Tube size: 7.0 mm Number of attempts: 1 Airway Equipment and Method: Stylet Placement Confirmation: ETT inserted through vocal cords under direct vision,  positive ETCO2 and breath sounds checked- equal and bilateral Secured at: 21 cm Tube secured with: Tape Dental Injury: Teeth and Oropharynx as per pre-operative assessment

## 2013-05-30 NOTE — Transfer of Care (Signed)
Immediate Anesthesia Transfer of Care Note  Patient: Rhonda Ruiz  Procedure(s) Performed: Procedure(s): LEFT REVERSE SHOULDER ARTHROPLASTY (Left)  Patient Location: PACU  Anesthesia Type:General  Level of Consciousness: awake, alert  and oriented  Airway & Oxygen Therapy: Patient Spontanous Breathing and Patient connected to face mask oxygen  Post-op Assessment: Report given to PACU RN, Post -op Vital signs reviewed and stable and Patient moving all extremities X 4  Post vital signs: Reviewed and stable  Complications: No apparent anesthesia complications

## 2013-05-30 NOTE — Anesthesia Preprocedure Evaluation (Signed)
Anesthesia Evaluation  Patient identified by MRN, date of birth, ID band Patient awake    Reviewed: Allergy & Precautions, H&P , NPO status , Patient's Chart, lab work & pertinent test results  Airway Mallampati: II TM Distance: >3 FB Neck ROM: Full    Dental  (+) Teeth Intact, Dental Advisory Given   Pulmonary  breath sounds clear to auscultation        Cardiovascular hypertension (Pt denies), Rhythm:Regular Rate:Normal     Neuro/Psych    GI/Hepatic   Endo/Other  Morbid obesity  Renal/GU      Musculoskeletal   Abdominal   Peds  Hematology   Anesthesia Other Findings   Reproductive/Obstetrics                           Anesthesia Physical Anesthesia Plan  ASA: III  Anesthesia Plan: General   Post-op Pain Management:    Induction: Intravenous  Airway Management Planned: Oral ETT  Additional Equipment:   Intra-op Plan:   Post-operative Plan: Extubation in OR  Informed Consent: I have reviewed the patients History and Physical, chart, labs and discussed the procedure including the risks, benefits and alternatives for the proposed anesthesia with the patient or authorized representative who has indicated his/her understanding and acceptance.   Dental advisory given  Plan Discussed with: CRNA, Anesthesiologist and Surgeon  Anesthesia Plan Comments:         Anesthesia Quick Evaluation

## 2013-05-30 NOTE — Progress Notes (Signed)
Ermalinda Barrios, CRNA at bedside.

## 2013-05-30 NOTE — Progress Notes (Signed)
Utilization review completed.  

## 2013-05-30 NOTE — Brief Op Note (Signed)
05/30/2013  12:24 PM  PATIENT:  Rhonda Ruiz  58 y.o. 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  PROCEDURE:  Procedure(s): LEFT REVERSE SHOULDER ARTHROPLASTY (Left)  SURGEON:  Surgeon(s) and Role:    * [name], MD - Primary  PHYSICIAN ASSISTANT:   ASSISTANTS: Ventura Bruns, PA-C   ANESTHESIA:   regional and general  EBL:  Total I/O In: 700 [I.V.:700] Out: 150 [Blood:150]  BLOOD ADMINISTERED:none  DRAINS: none   LOCAL MEDICATIONS USED:  MARCAINE     SPECIMEN:  No Specimen  DISPOSITION OF SPECIMEN:  N/A  COUNTS:  YES  TOURNIQUET:  * No tourniquets in log *  DICTATION: .Other Dictation: Dictation Number [id number]  PLAN OF CARE: Admit to inpatient   PATIENT DISPOSITION:  PACU - hemodynamically stable.   Delay start of Pharmacological VTE agent (>24hrs) due to surgical blood loss or risk of bleeding: no

## 2013-05-31 LAB — BASIC METABOLIC PANEL
BUN: 17 mg/dL (ref 6–23)
CO2: 14 meq/L — AB (ref 19–32)
Calcium: 9 mg/dL (ref 8.4–10.5)
Chloride: 106 mEq/L (ref 96–112)
Creatinine, Ser: 0.76 mg/dL (ref 0.50–1.10)
GFR calc non Af Amer: >90 mL/min (ref >90)
GLUCOSE: 123 mg/dL — AB (ref 70–99)
POTASSIUM: 5.2 meq/L (ref 3.7–5.3)
Sodium: 138 mEq/L (ref 137–147)

## 2013-05-31 LAB — CBC
HEMATOCRIT: 38.8 % (ref 36.0–46.0)
HEMOGLOBIN: 13.2 g/dL (ref 12.0–15.0)
MCH: 30.1 pg (ref 26.0–34.0)
MCHC: 34 g/dL (ref 30.0–36.0)
MCV: 88.4 fL (ref 78.0–100.0)
Platelets: 209 10*3/uL (ref 150–400)
RBC: 4.39 MIL/uL (ref 3.87–5.11)
RDW: 13.3 % (ref 11.5–15.5)
WBC: 9.7 10*3/uL (ref 4.0–10.5)

## 2013-05-31 NOTE — Evaluation (Signed)
Occupational Therapy Evaluation Patient Details Name: Rhonda Ruiz MRN: [id number] DOB: 06-12-1955 Today's Date: 05/31/2013    History of Present Illness 58 y.o. s/p LEFT REVERSE SHOULDER ARTHROPLASTY (Left)   Clinical Impression   Pt s/p above. Education provided during session and pt performed exercises. Plan to see pt for additional session to review and try using cane. Recommending HHOT upon d/c.    Follow Up Recommendations  Home health OT;Supervision/Assistance - 24 hour    Equipment Recommendations  None recommended by OT    Recommendations for Other Services       Precautions / Restrictions Precautions Precautions: Shoulder;Fall Type of Shoulder Precautions: Norris reverse shoulder protocol Shoulder Interventions: Shoulder sling/immobilizer;For comfort Precaution Booklet Issued: Yes (comment) Required Braces or Orthoses: Sling Restrictions Weight Bearing Restrictions: Yes LUE Weight Bearing: Non weight bearing      Mobility Bed Mobility Overal bed mobility: Needs Assistance Bed Mobility: Rolling;Sit to Supine;Sidelying to Sit Rolling: Supervision Sidelying to sit: Supervision   Sit to supine: Supervision   General bed mobility comments: cues for technique.  Transfers Overall transfer level: Needs assistance Equipment used: None Transfers: Sit to/from Stand Sit to Stand: Supervision              Balance                                            ADL Overall ADL's : Needs assistance/impaired                     Lower Body Dressing: Minimal assistance;Sit to/from stand;With adaptive equipment   Toilet Transfer: Supervision/safety;Ambulation;Comfort height toilet           Functional mobility during ADLs: Supervision/safety;Min guard General ADL Comments: Practiced with sockaid-cues to not use LUE too much when positioning sock on sockaid. Pt used right hand to pull on strings. Educated on information on shoulder  handout. Recommended sitting for bathing and having family with her. Pt performed exercises. Educated on shoulder sling. Told pt would like someone to stay with her for a little while.     Vision                     Perception     Praxis      Pertinent Vitals/Pain Pain 4/10. Nurse aware. Ice applied     Hand Dominance Right   Extremity/Trunk Assessment Upper Extremity Assessment Upper Extremity Assessment: LUE deficits/detail LUE Deficits / Details: s/p reverse TSA   Lower Extremity Assessment Lower Extremity Assessment: Overall WFL for tasks assessed       Communication Communication Communication: No difficulties   Cognition Arousal/Alertness: Awake/alert Behavior During Therapy: WFL for tasks assessed/performed Overall Cognitive Status: Within Functional Limits for tasks assessed                     General Comments       Exercises Exercises: Shoulder;Other exercises Other Exercises Other Exercises: forearm supination/pronation 10 reps   Shoulder Instructions Shoulder Instructions Donning/doffing shirt without moving shoulder:  (educated) Method for sponge bathing under operated UE:  (educated) Donning/doffing sling/immobilizer:  (educated and demonstrated) Correct positioning of sling/immobilizer:  (educated/demonstrated) Pendulum exercises (written home exercise program): Moderate assistance (a lot of assistance at hips) ROM for elbow, wrist and digits of operated UE: Minimal assistance Sling wearing schedule (on at all times/off for ADL's):  (  educated for comfort) Proper positioning of operated UE when showering:  (Pt demonstrated) Positioning of UE while sleeping: Patient able to independently direct caregiver    [place] expects to be discharged to:: Private residence Living Arrangements: Alone Available Help at Discharge: Family;Available PRN/intermittently Type of Home: House Home Access: Stairs to enter [employer] of Steps: 5 Entrance Stairs-Rails: Right;Left Home Layout: One level     Bathroom Shower/Tub: Tub/shower unit;Walk-in shower   Bathroom Toilet: Handicapped height     Home Equipment: Adaptive equipment;Hand held shower head Adaptive Equipment: Reacher;Sock aid;Long-handled sponge        Prior Functioning/Environment Level of Independence: Independent             OT Diagnosis: Acute pain   OT Problem List: Decreased strength;Impaired balance (sitting and/or standing);Impaired vision/perception;Decreased knowledge of use of DME or AE;Decreased knowledge of precautions;Pain;Obesity;Impaired UE functional use   OT Treatment/Interventions: Self-care/ADL training;Therapeutic exercise;DME and/or AE instruction;Therapeutic activities;Patient/family education;Balance training    OT Goals(Current goals can be found in the care plan section) Acute Rehab OT Goals Patient Stated Goal: not stated OT Goal Formulation: With patient Time For Goal Achievement: 06/07/13 Potential to Achieve Goals: Good ADL Goals Additional ADL Goal #1: Pt will independently perform HEP for LUE with handout provided. Additional ADL Goal #2: Pt will independently verbalize how family can assist with ADLs.  OT Frequency: Min 2X/week   Barriers to D/C:            Co-evaluation              End of Session Equipment Utilized During Treatment: Other (comment) (sling) Nurse Communication: Mobility status  Activity Tolerance: Patient tolerated treatment well Patient left: in bed;with call bell/phone within reach   Time: 1751-0258 OT Time Calculation (min): 40 min Charges:  OT General Charges $OT Visit: 1 Procedure OT Evaluation $Initial OT Evaluation Tier I: 1 Procedure OT Treatments $Self Care/Home Management : 8-22 mins $Therapeutic Exercise: 8-22 mins G-Codes:    Benito Mccreedy OTR/L [phone] 05/31/2013, 9:47 AM

## 2013-05-31 NOTE — Progress Notes (Addendum)
Occupational Therapy Treatment Patient Details Name: Rhonda Ruiz MRN: [id number] DOB: Feb 12, 1955 Today's Date: 05/31/2013    History of present illness 58 y.o. s/p LEFT REVERSE SHOULDER ARTHROPLASTY (Left)   OT comments  Reviewed information and pt performed exercises as well as practiced ADLs. Pt planning to d/c today. Family present for education.  Follow Up Recommendations  Home health OT;Supervision/Assistance - 24 hour    Equipment Recommendations  Other (comment) (cane)    Recommendations for Other Services      Precautions / Restrictions Precautions Precautions: Shoulder;Fall Type of Shoulder Precautions: Norris reverse shoulder protocol-No abduction Shoulder Interventions: Shoulder sling/immobilizer;For comfort Precaution Booklet Issued: Yes (comment) Required Braces or Orthoses: Sling Restrictions Weight Bearing Restrictions: Yes LUE Weight Bearing: Non weight bearing       Mobility Bed Mobility Overal bed mobility: Needs Assistance Bed Mobility: Rolling;Sidelying to Sit;Sit to Sidelying Rolling: Supervision Sidelying to sit: Supervision     Sit to sidelying: Min assist General bed mobility comments: Assistance with LE.   Transfers Overall transfer level: Needs assistance   Transfers: Sit to/from Stand Sit to Stand: Supervision              Balance                                   ADL Overall ADL's : Needs assistance/impaired                 Upper Body Dressing : Sitting;Moderate assistance   Lower Body Dressing: Moderate assistance;Sit to/from stand;With adaptive equipment   Toilet Transfer: Ambulation;Min guard (cane; bed)           Functional mobility during ADLs: Min guard;Cane General ADL Comments: Pt performed part of LB and UB dressing-used reacher. Cues for precautions in session. Recommended sitting for most of bathing/dressing. Recommended pt's mom staying with her for a few days. Practiced ambulating  with cane and educated on technique.  Pt performed exercises. educated on what pt could use for shower chair at home. Educated on safety during session.       Vision                     Perception     Praxis      Cognition   Behavior During Therapy: [facility] for tasks assessed/performed Overall Cognitive Status: Within Functional Limits for tasks assessed                       Extremity/Trunk Assessment               Exercises Shoulder Exercises- OT supported left elbow during some exercises to try to prevent abduction Pendulum Exercise: Left;10 reps;Standing Shoulder Flexion: AAROM;Left;10 reps;Supine Shoulder External Rotation: AAROM;Left;10 reps;Supine Elbow Flexion: Left;10 reps;Standing;AAROM Elbow Extension: AAROM;Left;10 reps;Standing Wrist Flexion: AROM;Left;10 reps;Standing Wrist Extension: AROM;Left;10 reps;Standing Digit Composite Flexion: AROM;Left;10 reps;Standing Composite Extension: AROM;Left;10 reps;Standing Neck Flexion: AROM;10 reps;Seated Neck Extension: AROM;10 reps;Seated Neck Lateral Flexion - Right: AROM;10 reps;Seated Neck Lateral Flexion - Left: AROM;10 reps;Seated Other Exercises Other Exercises: forearm supination/pronation Donning/doffing shirt without moving shoulder: Moderate assistance Method for sponge bathing under operated UE:  (educated) Donning/doffing sling/immobilizer: Moderate assistance Correct positioning of sling/immobilizer: Moderate assistance Pendulum exercises (written home exercise program): Moderate assistance (assistance at hips) ROM for elbow, wrist and digits of operated UE: Minimal assistance Sling wearing schedule (on at all times/off for ADL's): Patient able to independently  direct caregiver Proper positioning of operated UE when showering: Patient able to independently direct caregiver Positioning of UE while sleeping:  (educated)   Shoulder Instructions Shoulder Instructions Donning/doffing shirt  without moving shoulder: Moderate assistance Method for sponge bathing under operated UE:  (educated) Donning/doffing sling/immobilizer: Moderate assistance Correct positioning of sling/immobilizer: Moderate assistance Pendulum exercises (written home exercise program): Moderate assistance (assistance at hips) ROM for elbow, wrist and digits of operated UE: Minimal assistance Sling wearing schedule (on at all times/off for ADL's): Patient able to independently direct caregiver Proper positioning of operated UE when showering: Patient able to independently direct caregiver Positioning of UE while sleeping:  (educated)     General Comments      Pertinent Vitals/ Pain       Pain 5/10. Increased activity during session.   Home Living                                          Prior Functioning/Environment              Frequency Min 2X/week     Progress Toward Goals  OT Goals(current goals can now be found in the care plan section)  Progress towards OT goals: Progressing toward goals  Acute Rehab OT Goals Patient Stated Goal: not stated OT Goal Formulation: With patient Time For Goal Achievement: 06/07/13 Potential to Achieve Goals: Good ADL Goals Additional ADL Goal #1: Pt will independently perform HEP for LUE with handout provided. Additional ADL Goal #2: Pt will independently verbalize how family can assist with ADLs.  Plan Discharge plan remains appropriate    Co-evaluation                 End of Session Equipment Utilized During Treatment: Other (comment) (sling and straight cane)   Activity Tolerance Patient tolerated treatment well   Patient Left in bed;with family/visitor present   Nurse Communication Other (comment) (DME-cane)        Time: 9147-8295 OT Time Calculation (min): 37 min  Charges: OT General Charges $OT Visit: 1 Procedure OT Treatments $Self Care/Home Management : 8-22 mins $Therapeutic Exercise: 8-22  mins  Benito Mccreedy OTR/L [phone] 05/31/2013, 3:34 PM

## 2013-05-31 NOTE — Progress Notes (Signed)
Patient d/c to home, IV removed, prescriptions given, instructions reviewed.  HHPT/OT arranged.

## 2013-05-31 NOTE — Progress Notes (Signed)
   Subjective: 1 Day Post-Op Procedure(s) (LRB): LEFT REVERSE SHOULDER ARTHROPLASTY (Left)   Patient reports pain as mild, pain controlled well. No events throughout the night. Denies any CP or SOB. States that she feels ready to be discharged home.  Objective:   VITALS:   Filed Vitals:   05/31/13 0630  BP: 142/61  Pulse: 101  Temp: 98.5 F (36.9 C)  Resp: 18    Neurovascular intact Incision: dressing C/D/I No cellulitis present Compartment soft  LABS  Recent Labs  05/28/13 0927 05/31/13 0622  HGB 13.4 13.2  HCT 38.5 38.8  WBC 6.3 9.7  PLT 261 209     Recent Labs  05/28/13 0927 05/31/13 0622  NA 141 138  K 5.0 5.2  BUN 20 17  CREATININE 0.70 0.76  GLUCOSE 113* 123*     Assessment/Plan: 1 Day Post-Op Procedure(s) (LRB): LEFT REVERSE SHOULDER ARTHROPLASTY (Left) Advance diet Up with therapy D/C IV fluids Discharge home with home health Follow up in 2 weeks at [facility]. Follow up with Dr Veverly Fells in 2 weeks.  Contact information:  [facility] [address] [facility] Rhonda Ruiz   PAC  05/31/2013, 8:10 AM

## 2013-05-31 NOTE — Op Note (Signed)
NAMEARIBELLA, VAVRA NO.:  [id number]  MEDICAL RECORD NO.:  [id number]  LOCATION:  5N13C                        FACILITY:  [facility]  PHYSICIAN:  Doran Heater. Veverly Fells, M.D. DATE OF BIRTH:  [birth year]  DATE OF PROCEDURE:  05/30/2013 DATE OF DISCHARGE:                              OPERATIVE REPORT   PREOPERATIVE DIAGNOSIS:  Left shoulder rotator cuff tear arthropathy.  POSTOPERATIVE DIAGNOSIS:  Left shoulder rotator cuff tear arthropathy.  PROCEDURE PERFORMED:  Left shoulder reverse total shoulder arthroplasty.  ATTENDING SURGEON:  Doran Heater. Veverly Fells, M.D.  ASSISTANT:  Abbott Pao. Dixon, P.A., who was scrubbed during the entire procedure and necessary for satisfactory completion of surgery.  ANESTHESIA:  General anesthesia was used plus interscalene block.  ESTIMATED BLOOD LOSS:  Minimal.  FLUID REPLACEMENT:  1200 mL crystalloids.  INSTRUMENT COUNTS:  Correct.  COMPLICATIONS:  There were no complications.  ANTIBIOTICS:  Perioperative antibiotics were given.  INDICATIONS:  The patient is a 58 year old female with worsening left shoulder pain secondary to combination of arthritis and rotator cuff insufficiency.  The patient has had progressive pain and terrible function and has been managed conservatively for an extended period. The patient presents desiring reverse total shoulder arthroplasty to restore function and eliminate pain in her shoulder.  Informed consent obtained.  DESCRIPTION OF PROCEDURE:  After an adequate level of anesthesia was achieved, the patient was positioned in the modified beach-chair position.  Left shoulder was correctly identified and sterilely prepped and draped in usual manner.  Time-out called.  After a time-out, we entered the shoulder using anterior deltopectoral incision, started at the coracoid process, extending down to the anterior humerus. Dissection down through subcutaneous tissues.  We identified the cephalic vein and took  it laterally with the deltoid.  Pectoralis was identified and retracted medially.  The conjoined tendon was identified and retracted medially.  The upper centimeter of pectoralis tendon was released.  We went ahead and identified the subscapularis and released it subperiosteally off the lesser tuberosity, placing #2 FiberWire sutures in a modified W stitch fashion into the free edge of the tendon for repair at the end of the case.  We released what was remaining of the rotator cuff, progressively externally rotating and delivering the humerus out of the wound.  We entered the proximal humerus with the reamer for a size 6 and opted size 8.  We cannot get the 10 reamer in because of how small her shoulder was.  The humeral head was probably less than 40 mm in diameter.  At this point, we went ahead and performed our neck cut using the DePuy Delta Xtend resection guide and set it about 10 degrees of retroversion.  We used an oscillating saw to accomplish the head cut.  We then went ahead and prepared our metaphysis and again due to the small size of proximal humerus, a size 8 metaphyseal reamer just barely fit.  We went ahead and reamed for the size 1 left and then placed our trial prosthesis which sat just a touch proud but was sitting on the cortical bone on the medial calcar.  We removed that, subluxed the humerus posteriorly, removed the biceps and the  remaining labral tissue, careful to protect the axillary nerve. Once we had 360-degree exposure, we removed remaining cartilage with a Cobb elevator, placed our guide pin central on the glenoid face, and then reamed for the metaglene.  We then drilled out the central peg hole for the metaglene and impacted the metaglene into position, paying constricted tension to the inferior and superior locations of the screw holes as we knew we would not be able to get anterior-posterior screws due to how small her glenoid face was.  We then went ahead  and placed a 36 inferior screw and a 30 proximal into the coracoid base, gaining outstanding purchase with those two screws and tightening this down with the locking mechanism.  We then went ahead and placed our 38 standard glenosphere in place and screwed that into position, again careful to make sure we did not have our nerve incarcerated.  We irrigated thoroughly and then placed our trial stem in position, reduced the shoulder with 38, +3.  We were happy with that soft tissue balancing. We removed the trial stem, thoroughly irrigated drill holes and placed #2 FiberWire sutures for repair of the subscapularis at the end.  We then did a combination of press-fit and cement.  We used a little bit of hand packed cement distally and then utilized our [place] HA proximally, impacting the stem into position, holding until cement was hardened.  At this point, we were ready to go ahead and trial.  We trialed with a 38, +3 and again we were happy with that reduction and trialing was nice, snap in place, and then the conjoint was tight. Gapping was subluxed with a distal pole and no gapping with external rotation.  We removed the trial 38, +3 and placed the real 38, +3, checked our axillary nerve again, irrigated thoroughly, and then repaired our subscapularis anatomically through bone and then also thoroughly irrigated the subdeltoid interval and then repaired the deltopectoral interval with 0 Vicryl suture followed by 2-0 Vicryl subcutaneous closure and 4-0 Monocryl for skin.  Steri-Strips applied followed by sterile dressing.  The patient tolerated the surgery well.     Doran Heater. Veverly Fells, M.D.     SRN/MEDQ  D:  05/30/2013  T:  05/31/2013  Job:  [id number]

## 2013-06-01 NOTE — Care Management Note (Signed)
    Page 1 of 2   06/01/2013     8:10:14 AM CARE MANAGEMENT NOTE 06/01/2013  Patient:  Rhonda Ruiz, Rhonda Ruiz   Account Number:  [id number]  Date Initiated:  05/31/2013  Documentation initiated by:  [facility]  Subjective/Objective Assessment:   adm: left shoulder pain; LEFT REVERSE SHOULDER ARTHROPLASTY (Left)     Action/Plan:   discharge planning   Anticipated DC Date:  05/31/2013   Anticipated DC Plan:  [place]  CM consult      [facility] Choice  HOME HEALTH   Choice offered to / List presented to:  C-1 Patient   DME arranged  CANE      DME agency  [place] arranged  [place].   Status of service:  Completed, signed off Medicare Important Message given?   (If response is "NO", the following Medicare IM given date fields will be blank) Date Medicare IM given:   Date Additional Medicare IM given:    Discharge Disposition:  [place]  Per UR Regulation:    If discussed at Long Length of Stay Meetings, dates discussed:    Comments:  05/31/13 14:10 Cm met with pt in room to offer choice for HHPT/OT.  Pt chooses [facility] to renders these services. Address and contact information verified with pt.  DME delivery rep called to deliver cane prior to discharge. Referral called to [facility] rep, stephanie for HHPT/OT.  No other CM needs were communicated.  Mariane Masters, BSN, CM [phone].

## 2013-06-02 ENCOUNTER — Telehealth: Payer: Self-pay | Admitting: Nurse Practitioner

## 2013-06-02 ENCOUNTER — Encounter (HOSPITAL_COMMUNITY): Payer: Self-pay | Admitting: Orthopedic Surgery

## 2013-06-03 NOTE — Telephone Encounter (Signed)
Will have to come from Dr. Veverly Fells because he has to order exactly what he wants them to do- can't just say needs physical therapy.

## 2013-06-03 NOTE — Telephone Encounter (Signed)
Left detailed message on voicemail.  

## 2013-06-05 NOTE — Discharge Summary (Signed)
Physician Discharge Summary  Patient ID: Rhonda Ruiz MRN: [id number] DOB/AGE: 07/26/1955 58 y.o.  Admit date: 05/30/2013 Discharge date: 05/31/2013   Procedures:  Procedure(s) (LRB): LEFT REVERSE SHOULDER ARTHROPLASTY (Left)  Attending Physician:  Dr. Netta Cedars   Admission Diagnoses:   Left shoulder pain  Discharge Diagnoses:  Active Problems:   Osteoarthrosis, unspecified whether generalized or localized, shoulder region  Past Medical History  Diagnosis Date  . Hyperlipidemia   . 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  . Sleep apnea     cpap 1-2 yrs  . Shortness of breath   . Pneumonia     hx  . Arthritis   . Anemia     hx-before hysterectomy  . Intracranial hypertension     See note under "Tumor of optic nerve".  Lestine Mount)     HPI: Pt is a 58 y.o. female complaining of left shoulder pain for multiple years. Pain had continually increased since the beginning. 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 Pt has tried various conservative treatments which have failed to alleviate their symptoms, including therapy and injections. Various options are discussed with the patient. Risks, benefits and expectations were discussed with the patient. Patient understand the risks, benefits and expectations and wishes to proceed with surgery.   PCP: Redge Gainer, MD   Discharged Condition: good  Hospital Course:  Patient underwent the above stated procedure on 05/30/2013. Patient tolerated the procedure well and brought to the recovery room in good condition and subsequently to the floor.  POD #1 BP: 142/61 ; Pulse: 101 ; Temp: 98.5 F (36.9 C) ; Resp: 18  Patient reports pain as mild, pain controlled well. No events throughout the night. Denies any CP or SOB. States that she feels ready to be  discharged home. Neurovascular intact, incision: dressing C/D/I, no cellulitis present and compartment soft.  LABS  Basename    HGB  13.2  HCT  38.8    Discharge Exam: General appearance: alert, cooperative and no distress Extremities: no edema, redness. no ulcers, gangrene or trophic changes  Disposition: Home with follow up in 2 weeks   Follow-up Information   Follow up with NORRIS,STEVEN R, MD. Call in 2 weeks. [phone])    Specialty:  Orthopedic Surgery   Contact information:   [address] [place] [address] [phone]       Follow up with [facility]. (home health physical and occupational therapy)    Contact information:   [address] [place] [address] [phone]           Medication List         acetaZOLAMIDE 250 MG tablet  Commonly known as:  DIAMOX  Take 500 mg by mouth 2 (two) times daily.     cephALEXin 500 MG capsule  Commonly known as:  KEFLEX  Take 1 capsule (500 mg total) by mouth 2 (two) times daily.     gemfibrozil 600 MG tablet  Commonly known as:  LOPID  Take 1 tablet (600 mg total) by mouth 2 (two) times daily before a meal.     methocarbamol 500 MG tablet  Commonly known as:  ROBAXIN  Take 1 tablet (500 mg total) by mouth 3 (three) times daily as needed for muscle  spasms.     oxyCODONE-acetaminophen 5-325 MG per tablet  Commonly known as:  ROXICET  Take 1-2 tablets by mouth every 4 (four) hours as needed for severe pain.         Signed: [place]. Lyndee Herbst   PA-C  06/05/2013, 11:01 AM

## 2013-06-06 ENCOUNTER — Other Ambulatory Visit: Payer: Self-pay | Admitting: General Practice

## 2013-06-06 NOTE — Telephone Encounter (Signed)
No lipid labs in over a year. Last OV 05-22-13 with [facility]. Please advise on refill

## 2013-06-09 NOTE — Telephone Encounter (Signed)
No more refills without eing seen- no lab work in over a year

## 2013-06-09 NOTE — Telephone Encounter (Signed)
Patient aware needs to be seen said she would call back

## 2013-12-02 ENCOUNTER — Encounter: Payer: Medicare Other | Admitting: Nurse Practitioner

## 2013-12-29 ENCOUNTER — Telehealth: Payer: Self-pay | Admitting: Nurse Practitioner

## 2013-12-29 NOTE — Telephone Encounter (Signed)
Lm,  Need to schedule an appointment to be seen and get labwork at that time for refills.

## 2013-12-29 NOTE — Telephone Encounter (Signed)
No ntbs for appointment

## 2013-12-30 MED ORDER — GEMFIBROZIL 600 MG PO TABS: ORAL_TABLET | ORAL | Status: DC

## 2013-12-30 NOTE — Telephone Encounter (Signed)
Last nmr was in 2014.

## 2014-01-08 ENCOUNTER — Telehealth: Payer: Self-pay | Admitting: Nurse Practitioner

## 2014-01-08 NOTE — Telephone Encounter (Signed)
It may be the quantity?

## 2014-01-10 NOTE — Telephone Encounter (Signed)
rx was denied because has not had labs since May 2015

## 2014-01-12 ENCOUNTER — Other Ambulatory Visit: Payer: Self-pay | Admitting: *Deleted

## 2014-01-12 ENCOUNTER — Telehealth: Payer: Self-pay | Admitting: Nurse Practitioner

## 2014-01-12 MED ORDER — GEMFIBROZIL 600 MG PO TABS: ORAL_TABLET | ORAL | Status: DC

## 2014-01-12 NOTE — Telephone Encounter (Signed)
Pt has appt scheduled 2/4, that was the earliest appt time with MMM. Rx for 90 day supply no refills sent to pharmacy and pt aware she will get further refills at her appt.

## 2014-01-12 NOTE — Telephone Encounter (Signed)
Rx was verified

## 2014-01-12 NOTE — Telephone Encounter (Signed)
Pt has appt 02/05/14, rx for 90 day supply sent to her pharmacy no refills, pt aware she will get further refills at her appt.

## 2014-01-17 ENCOUNTER — Other Ambulatory Visit: Payer: Self-pay | Admitting: Nurse Practitioner

## 2014-02-04 DIAGNOSIS — G4733 Obstructive sleep apnea (adult) (pediatric): Secondary | ICD-10-CM | POA: Diagnosis not present

## 2014-02-04 DIAGNOSIS — G932 Benign intracranial hypertension: Secondary | ICD-10-CM | POA: Diagnosis not present

## 2014-02-04 DIAGNOSIS — H4711 Papilledema associated with increased intracranial pressure: Secondary | ICD-10-CM | POA: Diagnosis not present

## 2014-02-04 DIAGNOSIS — H47293 Other optic atrophy, bilateral: Secondary | ICD-10-CM | POA: Diagnosis not present

## 2014-02-05 ENCOUNTER — Encounter: Payer: Self-pay | Admitting: Nurse Practitioner

## 2014-02-05 ENCOUNTER — Ambulatory Visit (INDEPENDENT_AMBULATORY_CARE_PROVIDER_SITE_OTHER): Payer: Medicare Other | Admitting: Nurse Practitioner

## 2014-02-05 VITALS — BP 136/81 | HR 70 | Temp 98.0°F | Ht 59.0 in | Wt 240.0 lb

## 2014-02-05 DIAGNOSIS — E785 Hyperlipidemia, unspecified: Secondary | ICD-10-CM

## 2014-02-05 DIAGNOSIS — I10 Essential (primary) hypertension: Secondary | ICD-10-CM

## 2014-02-05 DIAGNOSIS — H548 Legal blindness, as defined in USA: Secondary | ICD-10-CM

## 2014-02-05 MED ORDER — GEMFIBROZIL 600 MG PO TABS: ORAL_TABLET | ORAL | Status: DC

## 2014-02-05 MED ORDER — ACETAZOLAMIDE 250 MG PO TABS: 500.0000 mg | ORAL_TABLET | Freq: Two times a day (BID) | ORAL | Status: DC

## 2014-02-05 NOTE — Progress Notes (Signed)
Subjective:    Patient ID: Rhonda Ruiz, female    DOB: 1955-05-15, 59 y.o.   MRN: [id number]   Patient here today for follow up of chronic medical problems. No complaints today.   Hypertension This is a chronic problem. The current episode started more than 1 year ago. The problem is unchanged. The problem is controlled. Pertinent negatives include no chest pain, headaches, palpitations, peripheral edema or shortness of breath. Risk factors for coronary artery disease include dyslipidemia, obesity, post-menopausal state and sedentary lifestyle. Past treatments include diuretics. The current treatment provides moderate improvement. Compliance problems include diet and exercise.   Hyperlipidemia This is a chronic problem. This is a new diagnosis. The problem is controlled. Recent lipid tests were reviewed and are normal. Exacerbating diseases include obesity. She has no history of diabetes or hypothyroidism. Pertinent negatives include no chest pain or shortness of breath. Current antihyperlipidemic treatment includes statins. The current treatment provides moderate improvement of lipids. Compliance problems include adherence to diet and adherence to exercise.  Risk factors for coronary artery disease include dyslipidemia, hypertension, obesity, post-menopausal and a sedentary lifestyle.  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   HENT: Negative.   Respiratory: Negative for shortness of breath.   Cardiovascular: Negative for chest pain and palpitations.  Genitourinary: Negative.   Neurological: Negative for headaches.  Psychiatric/Behavioral: Negative.   All other systems reviewed and are negative.      Objective:   Physical Exam  Constitutional: She is oriented to person, place, and time. She appears well-developed and well-nourished.  HENT:  Nose: Nose normal.  Mouth/Throat: Oropharynx is clear and  moist.  Eyes: EOM are normal.  Neck: Trachea normal, normal range of motion and full passive range of motion without pain. Neck supple. No JVD present. Carotid bruit is not present. No thyromegaly present.  Cardiovascular: Normal rate, regular rhythm, normal heart sounds and intact distal pulses.  Exam reveals no gallop and no friction rub.   No murmur heard. Pulmonary/Chest: Effort normal and breath sounds normal.  Abdominal: Soft. Bowel sounds are normal. She exhibits no distension and no mass. There is no tenderness.  Musculoskeletal: Normal range of motion.  Lymphadenopathy:    She has no cervical adenopathy.  Neurological: She is alert and oriented to person, place, and time. She has normal reflexes.  Skin: Skin is warm and dry.  Psychiatric: She has a normal mood and affect. Her behavior is normal. Judgment and thought content normal.   BP 136/81 mmHg  Pulse 70  Temp(Src) 98 F (36.7 C) (Oral)  Ht 4' 11"  (1.499 m)  Wt 240 lb (108.863 kg)  BMI 48.45 kg/m2  SpO2 100%        Assessment & Plan:  1. HYPERTENSION, BENIGN Do not add slat to diet - acetaZOLAMIDE (DIAMOX) 250 MG tablet; Take 2 tablets (500 mg total) by mouth 2 (two) times daily.  Dispense: 120 tablet; Refill: 5 - CMP14+EGFR - CMP14+EGFR  2. Hyperlipidemia with target LDL less than 100 Low fat diet - gemfibrozil (LOPID) 600 MG tablet; Take 1 tablet by mouth two  times daily before meals  Dispense: 180 tablet; Refill: 1 - NMR, lipoprofile - NMR, lipoprofile  3. Legally blind  4. Morbid obesity Weight watchers encouraged   Refuses all health maintenance Labs pending Health maintenance reviewed Diet and exercise encouraged Continue all meds Follow up  In 3 months   [place], FNP

## 2014-02-05 NOTE — Addendum Note (Signed)
Addended by: Pollyann Kennedy F on: 02/05/2014 02:36 PM   Modules accepted: Orders

## 2014-02-05 NOTE — Patient Instructions (Signed)
Exercise to Lose Weight Exercise and a healthy diet may help you lose weight. Your doctor may suggest specific exercises. EXERCISE IDEAS AND TIPS  Choose low-cost things you enjoy doing, such as walking, bicycling, or exercising to workout videos.  Take stairs instead of the elevator.  Walk during your lunch break.  Park your car further away from work or school.  Go to a gym or an exercise class.  Start with 5 to 10 minutes of exercise each day. Build up to 30 minutes of exercise 4 to 6 days a week.  Wear shoes with good support and comfortable clothes.  Stretch before and after working out.  Work out until you breathe harder and your heart beats faster.  Drink extra water when you exercise.  Do not do so much that you hurt yourself, feel dizzy, or get very short of breath. Exercises that burn about 150 calories:  Running 1  miles in 15 minutes.  Playing volleyball for 45 to 60 minutes.  Washing and waxing a car for 45 to 60 minutes.  Playing touch football for 45 minutes.  Walking 1  miles in 35 minutes.  Pushing a stroller 1  miles in 30 minutes.  Playing basketball for 30 minutes.  Raking leaves for 30 minutes.  Bicycling 5 miles in 30 minutes.  Walking 2 miles in 30 minutes.  Dancing for 30 minutes.  Shoveling snow for 15 minutes.  Swimming laps for 20 minutes.  Walking up stairs for 15 minutes.  Bicycling 4 miles in 15 minutes.  Gardening for 30 to 45 minutes.  Jumping rope for 15 minutes.  Washing windows or floors for 45 to 60 minutes. Document Released: 01/21/2010 Document Revised: 03/13/2011 Document Reviewed: 01/21/2010 Exit[employer] Patient Information 2015 [employer]. This information is not intended to replace advice given to you by your health care provider. Make sure you discuss any questions you have with your health care provider.  

## 2014-02-06 LAB — NMR, LIPOPROFILE
Cholesterol: 168 mg/dL (ref 100–199)
HDL Cholesterol by NMR: 49 mg/dL (ref >39)
HDL Particle Number: 30.7 umol/L (ref >=30.5)
LDL Particle Number: 1201 nmol/L — ABNORMAL HIGH (ref <1000)
LDL Size: 21.1 nm (ref >20.5)
LDL-C: 101 mg/dL — AB (ref 0–99)
LP-IR Score: 47 — ABNORMAL HIGH (ref <=45)
Small LDL Particle Number: 535 nmol/L — ABNORMAL HIGH (ref <=527)
Triglycerides by NMR: 88 mg/dL (ref 0–149)

## 2014-02-06 LAB — CMP14+EGFR
ALT: 17 IU/L (ref 0–32)
AST: 17 IU/L (ref 0–40)
Albumin/Globulin Ratio: 1.8 (ref 1.1–2.5)
Albumin: 4.4 g/dL (ref 3.5–5.5)
Alkaline Phosphatase: 63 IU/L (ref 39–117)
BUN/Creatinine Ratio: 28 — ABNORMAL HIGH (ref 9–23)
BUN: 23 mg/dL (ref 6–24)
CHLORIDE: 110 mmol/L — AB (ref 97–108)
CO2: 20 mmol/L (ref 18–29)
Calcium: 9.3 mg/dL (ref 8.7–10.2)
Creatinine, Ser: 0.82 mg/dL (ref 0.57–1.00)
GFR calc Af Amer: 92 mL/min/{1.73_m2} (ref >59)
GFR calc non Af Amer: 80 mL/min/{1.73_m2} (ref >59)
Globulin, Total: 2.5 g/dL (ref 1.5–4.5)
Glucose: 112 mg/dL — ABNORMAL HIGH (ref 65–99)
POTASSIUM: 4.4 mmol/L (ref 3.5–5.2)
Sodium: 144 mmol/L (ref 134–144)
Total Bilirubin: 0.3 mg/dL (ref 0.0–1.2)
Total Protein: 6.9 g/dL (ref 6.0–8.5)

## 2014-04-16 ENCOUNTER — Other Ambulatory Visit (INDEPENDENT_AMBULATORY_CARE_PROVIDER_SITE_OTHER): Payer: Medicare Other

## 2014-04-16 ENCOUNTER — Other Ambulatory Visit: Payer: Self-pay | Admitting: Orthopedic Surgery

## 2014-04-16 DIAGNOSIS — R52 Pain, unspecified: Secondary | ICD-10-CM

## 2014-04-16 DIAGNOSIS — S83511A Sprain of anterior cruciate ligament of right knee, initial encounter: Secondary | ICD-10-CM | POA: Diagnosis not present

## 2014-04-16 DIAGNOSIS — M25561 Pain in right knee: Secondary | ICD-10-CM

## 2014-04-16 DIAGNOSIS — Z0289 Encounter for other administrative examinations: Secondary | ICD-10-CM

## 2014-05-21 DIAGNOSIS — G4733 Obstructive sleep apnea (adult) (pediatric): Secondary | ICD-10-CM | POA: Diagnosis not present

## 2014-05-26 ENCOUNTER — Encounter: Payer: Self-pay | Admitting: Nurse Practitioner

## 2014-05-26 ENCOUNTER — Ambulatory Visit (INDEPENDENT_AMBULATORY_CARE_PROVIDER_SITE_OTHER): Payer: Medicare Other | Admitting: Nurse Practitioner

## 2014-05-26 VITALS — BP 117/79 | HR 77 | Temp 97.6°F | Ht <= 58 in | Wt 242.4 lb

## 2014-05-26 DIAGNOSIS — E785 Hyperlipidemia, unspecified: Secondary | ICD-10-CM

## 2014-05-26 DIAGNOSIS — I10 Essential (primary) hypertension: Secondary | ICD-10-CM

## 2014-05-26 DIAGNOSIS — H548 Legal blindness, as defined in USA: Secondary | ICD-10-CM | POA: Diagnosis not present

## 2014-05-26 DIAGNOSIS — J301 Allergic rhinitis due to pollen: Secondary | ICD-10-CM

## 2014-05-26 DIAGNOSIS — Z0000 Encounter for general adult medical examination without abnormal findings: Secondary | ICD-10-CM | POA: Diagnosis not present

## 2014-05-26 LAB — POCT UA - MICROSCOPIC ONLY
Bacteria, U Microscopic: NEGATIVE
Casts, Ur, LPF, POC: NEGATIVE
Crystals, Ur, HPF, POC: NEGATIVE
Mucus, UA: NEGATIVE
WBC, Ur, HPF, POC: NEGATIVE
Yeast, UA: NEGATIVE

## 2014-05-26 LAB — POCT URINALYSIS DIPSTICK
Bilirubin, UA: NEGATIVE
GLUCOSE UA: NEGATIVE
KETONES UA: NEGATIVE
Leukocytes, UA: NEGATIVE
NITRITE UA: NEGATIVE
Protein, UA: NEGATIVE
RBC UA: NEGATIVE
Spec Grav, UA: 1.01
UROBILINOGEN UA: NEGATIVE
pH, UA: 6.5

## 2014-05-26 LAB — POCT CBC
GRANULOCYTE PERCENT: 60.7 % (ref 37–80)
HEMATOCRIT: 42.2 % (ref 37.7–47.9)
HEMOGLOBIN: 13.1 g/dL (ref 12.2–16.2)
LYMPH, POC: 2.2 (ref 0.6–3.4)
MCH: 27.3 pg (ref 27–31.2)
MCHC: 31.1 g/dL — AB (ref 31.8–35.4)
MCV: 87.8 fL (ref 80–97)
MPV: 8.1 fL (ref 0–99.8)
PLATELET COUNT, POC: 247 10*3/uL (ref 142–424)
POC Granulocyte: 3.6 (ref 2–6.9)
POC LYMPH PERCENT: 36.9 %L (ref 10–50)
RBC: 4.8 M/uL (ref 4.04–5.48)
RDW, POC: 13.5 %
WBC: 5.9 10*3/uL (ref 4.6–10.2)

## 2014-05-26 MED ORDER — FLUTICASONE PROPIONATE 50 MCG/ACT NA SUSP: 2.0000 | Freq: Every day | NASAL | Status: DC

## 2014-05-26 MED ORDER — GEMFIBROZIL 600 MG PO TABS: ORAL_TABLET | ORAL | Status: DC

## 2014-05-26 MED ORDER — ACETAZOLAMIDE 250 MG PO TABS: 500.0000 mg | ORAL_TABLET | Freq: Two times a day (BID) | ORAL | Status: DC

## 2014-05-26 NOTE — Addendum Note (Signed)
Addended by: Chevis Pretty on: 05/26/2014 11:05 AM   Modules accepted: Orders

## 2014-05-26 NOTE — Addendum Note (Signed)
Addended by: Earlene Plater on: 05/26/2014 11:10 AM   Modules accepted: Miquel Dunn

## 2014-05-26 NOTE — Progress Notes (Addendum)
Subjective:    Patient ID: Rhonda Ruiz, female    DOB: 01/02/1956, 59 y.o.   MRN: [id number]   Patient here today for follow up of chronic medical problems, annual physical exam, no PAP. Only complaint is congestion- started when she mowed the yard the other day. Lots of sneezing and watery eyes.   Hypertension This is a chronic problem. The current episode started more than 1 year ago. The problem is unchanged. The problem is controlled. Pertinent negatives include no chest pain, headaches, palpitations, peripheral edema or shortness of breath. Risk factors for coronary artery disease include dyslipidemia, obesity, post-menopausal state and sedentary lifestyle. Past treatments include diuretics. The current treatment provides moderate improvement. Compliance problems include diet and exercise.   Hyperlipidemia This is a chronic problem. This is a new diagnosis. The problem is controlled. Recent lipid tests were reviewed and are normal. Exacerbating diseases include obesity. She has no history of diabetes or hypothyroidism. Pertinent negatives include no chest pain or shortness of breath. Current antihyperlipidemic treatment includes statins. The current treatment provides moderate improvement of lipids. Compliance problems include adherence to diet and adherence to exercise.  Risk factors for coronary artery disease include dyslipidemia, hypertension, obesity, post-menopausal and a sedentary lifestyle.  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   HENT: Negative.   Respiratory: Negative for shortness of breath.   Cardiovascular: Negative for chest pain and palpitations.  Genitourinary: Negative.   Neurological: Negative for headaches.  Psychiatric/Behavioral: Negative.   All other systems reviewed and are negative.      Objective:   Physical Exam  Constitutional: She is oriented to person, place, and time.  She appears well-developed and well-nourished.  HENT:  Nose: Nose normal.  Mouth/Throat: Oropharynx is clear and moist.  Eyes: EOM are normal.  Neck: Trachea normal, normal range of motion and full passive range of motion without pain. Neck supple. No JVD present. Carotid bruit is not present. No thyromegaly present.  Cardiovascular: Normal rate, regular rhythm, normal heart sounds and intact distal pulses.  Exam reveals no gallop and no friction rub.   No murmur heard. Pulmonary/Chest: Effort normal and breath sounds normal.  Abdominal: Soft. Bowel sounds are normal. She exhibits no distension and no mass. There is no tenderness.  Musculoskeletal: Normal range of motion. She exhibits edema (2+ bil lower ext).  Lymphadenopathy:    She has no cervical adenopathy.  Neurological: She is alert and oriented to person, place, and time. She has normal reflexes.  Skin: Skin is warm and dry.  Psychiatric: She has a normal mood and affect. Her behavior is normal. Judgment and thought content normal.    BP 117/79 mmHg  Pulse 77  Temp(Src) 97.6 F (36.4 C) (Oral)  Ht 4' 9.5" (1.461 m)  Wt 242 lb 6.4 oz (109.952 kg)  BMI 51.51 kg/m2        Assessment & Plan:  1. Annual physical exam - POCT urinalysis dipstick - POCT UA - Microscopic Only - POCT CBC - Thyroid Panel With TSH - Vit D  25 hydroxy (rtn osteoporosis monitoring)  2. HYPERTENSION, BENIGN Do not add salt to diet - acetaZOLAMIDE (DIAMOX) 250 MG tablet; Take 2 tablets (500 mg total) by mouth 2 (two) times daily.  Dispense: 120 tablet; Refill: 5 - CMP14+EGFR  3. Morbid obesity Discussed diet and exercise for person with BMI >25 Will recheck weight in 3-6 months  4. Hyperlipidemia with  target LDL less than 100 Low fat diet - gemfibrozil (LOPID) 600 MG tablet; Take 1 tablet by mouth two  times daily before meals  Dispense: 180 tablet; Refill: 1 - NMR, lipoprofile  5. Legally blind 6. Allergic rhiitis - flonase 2 sprays  each nostril QD- #1 1 refill  Encouraged to schedule mammogram Labs pending Health maintenance reviewed- refuses most of health maintenance Diet and exercise encouraged Continue all meds Follow up  In 3 months   [place], FNP

## 2014-05-26 NOTE — Patient Instructions (Signed)
Fat and Cholesterol Control Diet Fat and cholesterol levels in your blood and organs are influenced by your diet. High levels of fat and cholesterol may lead to diseases of the heart, small and large blood vessels, gallbladder, liver, and pancreas. CONTROLLING FAT AND CHOLESTEROL WITH DIET Although exercise and lifestyle factors are important, your diet is key. That is because certain foods are known to raise cholesterol and others to lower it. The goal is to balance foods for their effect on cholesterol and more importantly, to replace saturated and trans fat with other types of fat, such as monounsaturated fat, polyunsaturated fat, and omega-3 fatty acids. On average, a person should consume no more than 15 to 17 g of saturated fat daily. Saturated and trans fats are considered "bad" fats, and they will raise LDL cholesterol. Saturated fats are primarily found in animal products such as meats, butter, and cream. However, that does not mean you need to give up all your favorite foods. Today, there are good tasting, low-fat, low-cholesterol substitutes for most of the things you like to eat. Choose low-fat or nonfat alternatives. Choose round or loin cuts of red meat. These types of cuts are lowest in fat and cholesterol. Chicken (without the skin), fish, veal, and ground turkey breast are great choices. Eliminate fatty meats, such as hot dogs and salami. Even shellfish have little or no saturated fat. Have a 3 oz (85 g) portion when you eat lean meat, poultry, or fish. Trans fats are also called "partially hydrogenated oils." They are oils that have been scientifically manipulated so that they are solid at room temperature resulting in a longer shelf life and improved taste and texture of foods in which they are added. Trans fats are found in stick margarine, some tub margarines, cookies, crackers, and baked goods.  When baking and cooking, oils are a great substitute for butter. The monounsaturated oils are  especially beneficial since it is believed they lower LDL and raise HDL. The oils you should avoid entirely are saturated tropical oils, such as coconut and palm.  Remember to eat a lot from food groups that are naturally free of saturated and trans fat, including fish, fruit, vegetables, beans, grains (barley, rice, couscous, bulgur wheat), and pasta (without cream sauces).  IDENTIFYING FOODS THAT LOWER FAT AND CHOLESTEROL  Soluble fiber may lower your cholesterol. This type of fiber is found in fruits such as apples, vegetables such as broccoli, potatoes, and carrots, legumes such as beans, peas, and lentils, and grains such as barley. Foods fortified with plant sterols (phytosterol) may also lower cholesterol. You should eat at least 2 g per day of these foods for a cholesterol lowering effect.  Read package labels to identify low-saturated fats, trans fat free, and low-fat foods at the supermarket. Select cheeses that have only 2 to 3 g saturated fat per ounce. Use a heart-healthy tub margarine that is free of trans fats or partially hydrogenated oil. When buying baked goods (cookies, crackers), avoid partially hydrogenated oils. Breads and muffins should be made from whole grains (whole-wheat or whole oat flour, instead of "flour" or "enriched flour"). Buy non-creamy canned soups with reduced salt and no added fats.  FOOD PREPARATION TECHNIQUES  Never deep-fry. If you must fry, either stir-fry, which uses very little fat, or use non-stick cooking sprays. When possible, broil, bake, or roast meats, and steam vegetables. Instead of putting butter or margarine on vegetables, use lemon and herbs, applesauce, and cinnamon (for squash and sweet potatoes). Use nonfat   yogurt, salsa, and low-fat dressings for salads.  LOW-SATURATED FAT / LOW-FAT FOOD SUBSTITUTES Meats / Saturated Fat (g)  Avoid: Steak, marbled (3 oz/85 g) / 11 g  Choose: Steak, lean (3 oz/85 g) / 4 g  Avoid: Hamburger (3 oz/85 g) / 7  g  Choose: Hamburger, lean (3 oz/85 g) / 5 g  Avoid: Ham (3 oz/85 g) / 6 g  Choose: Ham, lean cut (3 oz/85 g) / 2.4 g  Avoid: Chicken, with skin, dark meat (3 oz/85 g) / 4 g  Choose: Chicken, skin removed, dark meat (3 oz/85 g) / 2 g  Avoid: Chicken, with skin, light meat (3 oz/85 g) / 2.5 g  Choose: Chicken, skin removed, light meat (3 oz/85 g) / 1 g Dairy / Saturated Fat (g)  Avoid: Whole milk (1 cup) / 5 g  Choose: Low-fat milk, 2% (1 cup) / 3 g  Choose: Low-fat milk, 1% (1 cup) / 1.5 g  Choose: Skim milk (1 cup) / 0.3 g  Avoid: Hard cheese (1 oz/28 g) / 6 g  Choose: Skim milk cheese (1 oz/28 g) / 2 to 3 g  Avoid: Cottage cheese, 4% fat (1 cup) / 6.5 g  Choose: Low-fat cottage cheese, 1% fat (1 cup) / 1.5 g  Avoid: Ice cream (1 cup) / 9 g  Choose: Sherbet (1 cup) / 2.5 g  Choose: Nonfat frozen yogurt (1 cup) / 0.3 g  Choose: Frozen fruit bar / trace  Avoid: Whipped cream (1 tbs) / 3.5 g  Choose: Nondairy whipped topping (1 tbs) / 1 g Condiments / Saturated Fat (g)  Avoid: Mayonnaise (1 tbs) / 2 g  Choose: Low-fat mayonnaise (1 tbs) / 1 g  Avoid: Butter (1 tbs) / 7 g  Choose: Extra light margarine (1 tbs) / 1 g  Avoid: Coconut oil (1 tbs) / 11.8 g  Choose: Olive oil (1 tbs) / 1.8 g  Choose: Corn oil (1 tbs) / 1.7 g  Choose: Safflower oil (1 tbs) / 1.2 g  Choose: Sunflower oil (1 tbs) / 1.4 g  Choose: Soybean oil (1 tbs) / 2.4 g  Choose: Canola oil (1 tbs) / 1 g Document Released: 12/19/2004 Document Revised: 04/15/2012 Document Reviewed: 03/19/2013 [employer] Patient Information 2015 [employer]. This information is not intended to replace advice given to you by your health care provider. Make sure you discuss any questions you have with your health care provider.  

## 2014-05-26 NOTE — Addendum Note (Signed)
Addended by: Chevis Pretty on: 05/26/2014 11:06 AM   Modules accepted: Orders

## 2014-05-27 ENCOUNTER — Ambulatory Visit: Payer: Medicare Other | Admitting: Nurse Practitioner

## 2014-05-27 LAB — CMP14+EGFR
ALBUMIN: 4.4 g/dL (ref 3.5–5.5)
ALT: 23 IU/L (ref 0–32)
AST: 17 IU/L (ref 0–40)
Albumin/Globulin Ratio: 1.7 (ref 1.1–2.5)
Alkaline Phosphatase: 64 IU/L (ref 39–117)
BUN/Creatinine Ratio: 32 — ABNORMAL HIGH (ref 9–23)
BUN: 24 mg/dL (ref 6–24)
Bilirubin Total: 0.3 mg/dL (ref 0.0–1.2)
CO2: 16 mmol/L — AB (ref 18–29)
Calcium: 9.2 mg/dL (ref 8.7–10.2)
Chloride: 106 mmol/L (ref 97–108)
Creatinine, Ser: 0.74 mg/dL (ref 0.57–1.00)
GFR calc non Af Amer: 90 mL/min/{1.73_m2} (ref >59)
GFR, EST AFRICAN AMERICAN: 103 mL/min/{1.73_m2} (ref >59)
Globulin, Total: 2.6 g/dL (ref 1.5–4.5)
Glucose: 106 mg/dL — ABNORMAL HIGH (ref 65–99)
Potassium: 4.4 mmol/L (ref 3.5–5.2)
Sodium: 141 mmol/L (ref 134–144)
Total Protein: 7 g/dL (ref 6.0–8.5)

## 2014-05-27 LAB — NMR, LIPOPROFILE
Cholesterol: 170 mg/dL (ref 100–199)
HDL CHOLESTEROL BY NMR: 51 mg/dL (ref >39)
HDL Particle Number: 34.5 umol/L (ref >=30.5)
LDL PARTICLE NUMBER: 1112 nmol/L — AB (ref <1000)
LDL SIZE: 21.3 nm (ref >20.5)
LDL-C: 105 mg/dL — ABNORMAL HIGH (ref 0–99)
LP-IR SCORE: 44 (ref <=45)
Small LDL Particle Number: 458 nmol/L (ref <=527)
Triglycerides by NMR: 70 mg/dL (ref 0–149)

## 2014-05-27 LAB — THYROID PANEL WITH TSH
Free Thyroxine Index: 1.7 (ref 1.2–4.9)
T3 Uptake Ratio: 24 % (ref 24–39)
T4 TOTAL: 7.1 ug/dL (ref 4.5–12.0)
TSH: 4.03 u[IU]/mL (ref 0.450–4.500)

## 2014-05-27 LAB — VITAMIN D 25 HYDROXY (VIT D DEFICIENCY, FRACTURES): Vit D, 25-Hydroxy: 23.2 ng/mL — ABNORMAL LOW (ref 30.0–100.0)

## 2014-10-06 LAB — FECAL OCCULT BLOOD, GUAIAC: FECAL OCCULT BLD: NEGATIVE

## 2014-10-07 DIAGNOSIS — G4733 Obstructive sleep apnea (adult) (pediatric): Secondary | ICD-10-CM | POA: Diagnosis not present

## 2014-10-28 ENCOUNTER — Encounter: Payer: Self-pay | Admitting: *Deleted

## 2014-11-17 DIAGNOSIS — G4733 Obstructive sleep apnea (adult) (pediatric): Secondary | ICD-10-CM | POA: Diagnosis not present

## 2014-11-25 ENCOUNTER — Other Ambulatory Visit: Payer: Self-pay

## 2014-11-25 DIAGNOSIS — E785 Hyperlipidemia, unspecified: Secondary | ICD-10-CM

## 2014-11-25 DIAGNOSIS — I10 Essential (primary) hypertension: Secondary | ICD-10-CM

## 2014-11-25 NOTE — Telephone Encounter (Signed)
Last seen 05/26/14  MMM This is for mail order

## 2014-11-27 MED ORDER — ACETAZOLAMIDE 250 MG PO TABS: 500.0000 mg | ORAL_TABLET | Freq: Two times a day (BID) | ORAL | Status: DC

## 2014-11-27 MED ORDER — GEMFIBROZIL 600 MG PO TABS: ORAL_TABLET | ORAL | Status: DC

## 2014-11-27 NOTE — Telephone Encounter (Addendum)
Last refill without being seen 

## 2014-11-27 NOTE — Telephone Encounter (Signed)
Left detailed message stating rx were sent over but no further refills would be sent in until pt is seen in the office and to CB with further questions or concerns.

## 2014-12-22 DIAGNOSIS — G4733 Obstructive sleep apnea (adult) (pediatric): Secondary | ICD-10-CM | POA: Diagnosis not present

## 2015-02-17 DIAGNOSIS — H47293 Other optic atrophy, bilateral: Secondary | ICD-10-CM | POA: Diagnosis not present

## 2015-02-17 DIAGNOSIS — H4711 Papilledema associated with increased intracranial pressure: Secondary | ICD-10-CM | POA: Diagnosis not present

## 2015-03-08 ENCOUNTER — Ambulatory Visit (INDEPENDENT_AMBULATORY_CARE_PROVIDER_SITE_OTHER): Payer: Medicare Other | Admitting: Nurse Practitioner

## 2015-03-08 ENCOUNTER — Encounter: Payer: Self-pay | Admitting: Nurse Practitioner

## 2015-03-08 VITALS — BP 127/81 | HR 67 | Temp 97.5°F | Ht <= 58 in | Wt 241.0 lb

## 2015-03-08 DIAGNOSIS — I10 Essential (primary) hypertension: Secondary | ICD-10-CM

## 2015-03-08 DIAGNOSIS — E785 Hyperlipidemia, unspecified: Secondary | ICD-10-CM | POA: Diagnosis not present

## 2015-03-08 MED ORDER — LISINOPRIL 10 MG PO TABS: 10.0000 mg | ORAL_TABLET | Freq: Every day | ORAL | Status: DC

## 2015-03-08 NOTE — Progress Notes (Signed)
   Subjective:    Patient ID: Rhonda Ruiz, female    DOB: 09-21-1955, 61 y.o.   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 SHe was switched to dr. Bishop Dublin by dr. Lucia Bitter and she wants patient to come off of diamox and wanted her to see [place] to discuss. Dr. Bishop Dublin wants to see her 6 weeks after we change meds.  * she also has hyperlipidemia and was put on lopid - has not been seen since started on meds.   Review of Systems  Constitutional: Negative.   HENT: Negative.   Respiratory: Negative.   Cardiovascular: Negative.   Genitourinary: Negative.   Neurological: Negative.   Psychiatric/Behavioral: Negative.   All other systems reviewed and are negative.      Objective:   Physical Exam  Constitutional: She is oriented to person, place, and time. She appears well-developed and well-nourished.  Cardiovascular: Normal rate, regular rhythm and normal heart sounds.   Pulmonary/Chest: Effort normal.  Neurological: She is alert and oriented to person, place, and time.  Skin: Skin is warm.  Psychiatric: She has a normal mood and affect. Her behavior is normal. Judgment and thought content normal.    BP 127/81 mmHg  Pulse 67  Temp(Src) 97.5 F (36.4 C) (Oral)  Ht '4\' 9"'$  (1.448 m)  Wt 241 lb (109.317 kg)  BMI 52.14 kg/m2       Assessment & Plan:  1. HYPERTENSION, BENIGN Stop diamox Keep check of blood pressure Make folow up appointment with Dr Toy Cookey - lisinopril (PRINIVIL,ZESTRIL) 10 MG tablet; Take 1 tablet (10 mg total) by mouth daily.  Dispense: 90 tablet; Refill: 1 - CMP14+EGFR  2. Hyperlipidemia with target LDL less than 100 Low fta diet - Lipid panel  3. Morbid obesity, unspecified obesity type ([place]) Discussed diet and exercise for person with BMI >25 Will recheck weight in 3-6 months     Labs pending Health maintenance reviewed Diet and  exercise encouraged Continue all meds Follow up  In 3 months   [place], FNP

## 2015-03-08 NOTE — Patient Instructions (Signed)
Hypertension Hypertension, commonly called high blood pressure, is when the force of blood pumping through your arteries is too strong. Your arteries are the blood vessels that carry blood from your heart throughout your body. A blood pressure reading consists of a higher number over a lower number, such as 110/72. The higher number (systolic) is the pressure inside your arteries when your heart pumps. The lower number (diastolic) is the pressure inside your arteries when your heart relaxes. Ideally you want your blood pressure below 120/80. Hypertension forces your heart to work harder to pump blood. Your arteries may become narrow or stiff. Having untreated or uncontrolled hypertension can cause heart attack, stroke, kidney disease, and other problems. RISK FACTORS Some risk factors for high blood pressure are controllable. Others are not.  Risk factors you cannot control include:   Race. You may be at higher risk if you are African American.  Age. Risk increases with age.  Gender. Men are at higher risk than women before age 45 years. After age 65, women are at higher risk than men. Risk factors you can control include:  Not getting enough exercise or physical activity.  Being overweight.  Getting too much fat, sugar, calories, or salt in your diet.  Drinking too much alcohol. SIGNS AND SYMPTOMS Hypertension does not usually cause signs or symptoms. Extremely high blood pressure (hypertensive crisis) may cause headache, anxiety, shortness of breath, and nosebleed. DIAGNOSIS To check if you have hypertension, your health care provider will measure your blood pressure while you are seated, with your arm held at the level of your heart. It should be measured at least twice using the same arm. Certain conditions can cause a difference in blood pressure between your right and left arms. A blood pressure reading that is higher than normal on one occasion does not mean that you need treatment. If  it is not clear whether you have high blood pressure, you may be asked to return on a different day to have your blood pressure checked again. Or, you may be asked to monitor your blood pressure at home for 1 or more weeks. TREATMENT Treating high blood pressure includes making lifestyle changes and possibly taking medicine. Living a healthy lifestyle can help lower high blood pressure. You may need to change some of your habits. Lifestyle changes may include:  Following the DASH diet. This diet is high in fruits, vegetables, and whole grains. It is low in salt, red meat, and added sugars.  Keep your sodium intake below 2,300 mg per day.  Getting at least 30-45 minutes of aerobic exercise at least 4 times per week.  Losing weight if necessary.  Not smoking.  Limiting alcoholic beverages.  Learning ways to reduce stress. Your health care provider may prescribe medicine if lifestyle changes are not enough to get your blood pressure under control, and if one of the following is true:  You are 18-59 years of age and your systolic blood pressure is above 140.  You are 60 years of age or older, and your systolic blood pressure is above 150.  Your diastolic blood pressure is above 90.  You have diabetes, and your systolic blood pressure is over 140 or your diastolic blood pressure is over 90.  You have kidney disease and your blood pressure is above 140/90.  You have heart disease and your blood pressure is above 140/90. Your personal target blood pressure may vary depending on your medical conditions, your age, and other factors. HOME CARE INSTRUCTIONS    Have your blood pressure rechecked as directed by your health care provider.   Take medicines only as directed by your health care provider. Follow the directions carefully. Blood pressure medicines must be taken as prescribed. The medicine does not work as well when you skip doses. Skipping doses also puts you at risk for  problems.  Do not smoke.   Monitor your blood pressure at home as directed by your health care provider. SEEK MEDICAL CARE IF:   You think you are having a reaction to medicines taken.  You have recurrent headaches or feel dizzy.  You have swelling in your ankles.  You have trouble with your vision. SEEK IMMEDIATE MEDICAL CARE IF:  You develop a severe headache or confusion.  You have unusual weakness, numbness, or feel faint.  You have severe chest or abdominal pain.  You vomit repeatedly.  You have trouble breathing. MAKE SURE YOU:   Understand these instructions.  Will watch your condition.  Will get help right away if you are not doing well or get worse.   This information is not intended to replace advice given to you by your health care provider. Make sure you discuss any questions you have with your health care provider.   Document Released: 12/19/2004 Document Revised: 05/05/2014 Document Reviewed: 10/11/2012 Elsevier Interactive Patient Education 2016 Elsevier Inc.  

## 2015-03-09 LAB — CMP14+EGFR
ALK PHOS: 69 IU/L (ref 39–117)
ALT: 19 IU/L (ref 0–32)
AST: 15 IU/L (ref 0–40)
Albumin/Globulin Ratio: 1.6 (ref 1.1–2.5)
Albumin: 4.3 g/dL (ref 3.5–5.5)
BILIRUBIN TOTAL: 0.2 mg/dL (ref 0.0–1.2)
BUN/Creatinine Ratio: 24 — ABNORMAL HIGH (ref 9–23)
BUN: 19 mg/dL (ref 6–24)
CHLORIDE: 109 mmol/L — AB (ref 96–106)
CO2: 22 mmol/L (ref 18–29)
CREATININE: 0.78 mg/dL (ref 0.57–1.00)
Calcium: 9.4 mg/dL (ref 8.7–10.2)
GFR calc Af Amer: 97 mL/min/{1.73_m2} (ref >59)
GFR calc non Af Amer: 84 mL/min/{1.73_m2} (ref >59)
GLOBULIN, TOTAL: 2.7 g/dL (ref 1.5–4.5)
Glucose: 98 mg/dL (ref 65–99)
Potassium: 4.9 mmol/L (ref 3.5–5.2)
SODIUM: 146 mmol/L — AB (ref 134–144)
Total Protein: 7 g/dL (ref 6.0–8.5)

## 2015-03-09 LAB — LIPID PANEL
CHOLESTEROL TOTAL: 174 mg/dL (ref 100–199)
Chol/HDL Ratio: 3.3 ratio units (ref 0.0–4.4)
HDL: 53 mg/dL (ref >39)
LDL Calculated: 99 mg/dL (ref 0–99)
TRIGLYCERIDES: 109 mg/dL (ref 0–149)
VLDL Cholesterol Cal: 22 mg/dL (ref 5–40)

## 2015-03-10 DIAGNOSIS — G4733 Obstructive sleep apnea (adult) (pediatric): Secondary | ICD-10-CM | POA: Diagnosis not present

## 2015-03-23 ENCOUNTER — Telehealth: Payer: Self-pay

## 2015-03-23 NOTE — Telephone Encounter (Signed)
rx refill

## 2015-03-24 ENCOUNTER — Other Ambulatory Visit: Payer: Self-pay

## 2015-03-24 DIAGNOSIS — J301 Allergic rhinitis due to pollen: Secondary | ICD-10-CM

## 2015-03-24 MED ORDER — FLUTICASONE PROPIONATE 50 MCG/ACT NA SUSP: 2.0000 | Freq: Every day | NASAL | Status: DC

## 2015-04-14 ENCOUNTER — Other Ambulatory Visit: Payer: Self-pay | Admitting: *Deleted

## 2015-04-26 ENCOUNTER — Encounter: Payer: Medicare Other | Admitting: *Deleted

## 2015-04-26 DIAGNOSIS — Z1231 Encounter for screening mammogram for malignant neoplasm of breast: Secondary | ICD-10-CM | POA: Diagnosis not present

## 2015-04-26 LAB — HM MAMMOGRAPHY

## 2015-05-05 ENCOUNTER — Encounter: Payer: Self-pay | Admitting: *Deleted

## 2015-05-07 ENCOUNTER — Other Ambulatory Visit: Payer: Self-pay | Admitting: Nurse Practitioner

## 2015-06-15 DIAGNOSIS — G4733 Obstructive sleep apnea (adult) (pediatric): Secondary | ICD-10-CM | POA: Diagnosis not present

## 2015-06-22 ENCOUNTER — Ambulatory Visit (INDEPENDENT_AMBULATORY_CARE_PROVIDER_SITE_OTHER): Payer: Medicare Other | Admitting: Family

## 2015-06-22 ENCOUNTER — Encounter: Payer: Self-pay | Admitting: Family

## 2015-06-22 VITALS — BP 133/89 | HR 93 | Temp 101.6°F | Ht <= 58 in | Wt 239.8 lb

## 2015-06-22 DIAGNOSIS — J209 Acute bronchitis, unspecified: Secondary | ICD-10-CM | POA: Diagnosis not present

## 2015-06-22 MED ORDER — LEVOFLOXACIN 500 MG PO TABS: 500.0000 mg | ORAL_TABLET | Freq: Every day | ORAL | Status: DC

## 2015-06-22 MED ORDER — BENZONATATE 200 MG PO CAPS: 200.0000 mg | ORAL_CAPSULE | Freq: Three times a day (TID) | ORAL | Status: DC | PRN

## 2015-06-22 NOTE — Progress Notes (Signed)
Subjective:    Patient ID: Rhonda Ruiz, female    DOB: Jul 07, 1955, 60 y.o.   MRN: [id number]  Cough This is a new problem. The current episode started in the past 7 days. The problem has been gradually worsening. The problem occurs every few minutes. The cough is productive of purulent sputum. Associated symptoms include chills, ear congestion, ear pain, a fever, headaches, myalgias, nasal congestion, rhinorrhea, shortness of breath and wheezing. Pertinent negatives include no postnasal drip or sore throat. The symptoms are aggravated by lying down. She has tried rest for the symptoms. The treatment provided mild relief. There is no history of asthma or COPD.  Fever  Associated symptoms include coughing, ear pain, headaches and wheezing. Pertinent negatives include no sore throat.      Review of Systems  Constitutional: Positive for fever and chills.  HENT: Positive for ear pain and rhinorrhea. Negative for postnasal drip and sore throat.   Eyes: Negative.   Respiratory: Positive for cough, shortness of breath and wheezing.   Cardiovascular: Negative.  Negative for palpitations.  Gastrointestinal: Negative.   Endocrine: Negative.   Genitourinary: Negative.   Musculoskeletal: Positive for myalgias.  Neurological: Positive for headaches.  Hematological: Negative.   Psychiatric/Behavioral: Negative.   All other systems reviewed and are negative.      Objective:   Physical Exam  Constitutional: She is oriented to person, place, and time. She appears well-developed and well-nourished. No distress.  HENT:  Head: Normocephalic and atraumatic.  Right Ear: External ear normal.  Left Ear: External ear normal.  Mouth/Throat: Oropharynx is clear and moist.  Nasal passage erythemas with mild swelling    Eyes: Pupils are equal, round, and reactive to light.  Neck: Normal range of motion. Neck supple. No thyromegaly present.  Cardiovascular: Normal rate, regular rhythm, normal heart  sounds and intact distal pulses.   No murmur heard. Pulmonary/Chest: Effort normal and breath sounds normal. No respiratory distress. She has no wheezes.  Abdominal: Soft. Bowel sounds are normal. She exhibits no distension. There is no tenderness.  Musculoskeletal: Normal range of motion. She exhibits edema (trace amt in BLE). She exhibits no tenderness.  Neurological: She is alert and oriented to person, place, and time.  Skin: Skin is warm and dry.  Psychiatric: She has a normal mood and affect. Her behavior is normal. Judgment and thought content normal.  Vitals reviewed.     BP 147/100 mmHg  Pulse 96  Temp(Src) 101.6 F (38.7 C) (Oral)  Ht 4\' 9"  (1.448 m)  Wt 239 lb 12.8 oz (108.773 kg)  BMI 51.88 kg/m2     Assessment & Plan:  1. Acute bronchitis, unspecified organism -- Take meds as prescribed - Use a cool mist humidifier  -Use saline nose sprays frequently -Saline irrigations of the nose can be very helpful if done frequently.  * 4X daily for 1 week*  * Use of a nettie pot can be helpful with this. Follow directions with this* -Force fluids -For any cough or congestion  Use plain Mucinex- regular strength or max strength is fine   * Children- consult with Pharmacist for dosing -For fever or aces or pains- take tylenol or ibuprofen appropriate for age and weight.  * for fevers greater than 101 orally you may alternate ibuprofen and tylenol every  3 hours. -Throat lozenges if help - levofloxacin (LEVAQUIN) 500 MG tablet; Take 1 tablet (500 mg total) by mouth daily.  Dispense: 7 tablet; Refill: 0 - benzonatate (TESSALON) 200 MG  capsule; Take 1 capsule (200 mg total) by mouth 3 (three) times daily as needed.  Dispense: 30 capsule; Refill: [place], FNP

## 2015-06-22 NOTE — Patient Instructions (Addendum)
Acute Bronchitis Bronchitis is inflammation of the airways that extend from the windpipe into the lungs (bronchi). The inflammation often causes mucus to develop. This leads to a cough, which is the most common symptom of bronchitis.  In acute bronchitis, the condition usually develops suddenly and goes away over time, usually in a couple weeks. Smoking, allergies, and asthma can make bronchitis worse. Repeated episodes of bronchitis may cause further lung problems.  CAUSES Acute bronchitis is most often caused by the same virus that causes a cold. The virus can spread from person to person (contagious) through coughing, sneezing, and touching contaminated objects. SIGNS AND SYMPTOMS   Cough.   Fever.   Coughing up mucus.   Body aches.   Chest congestion.   Chills.   Shortness of breath.   Sore throat.  DIAGNOSIS  Acute bronchitis is usually diagnosed through a physical exam. Your health care provider will also ask you questions about your medical history. Tests, such as chest X-rays, are sometimes done to rule out other conditions.  TREATMENT  Acute bronchitis usually goes away in a couple weeks. Oftentimes, no medical treatment is necessary. Medicines are sometimes given for relief of fever or cough. Antibiotic medicines are usually not needed but may be prescribed in certain situations. In some cases, an inhaler may be recommended to help reduce shortness of breath and control the cough. A cool mist vaporizer may also be used to help thin bronchial secretions and make it easier to clear the chest.  HOME CARE INSTRUCTIONS  Get plenty of rest.   Drink enough fluids to keep your urine clear or pale yellow (unless you have a medical condition that requires fluid restriction). Increasing fluids may help thin your respiratory secretions (sputum) and reduce chest congestion, and it will prevent dehydration.   Take medicines only as directed by your health care provider.  If  you were prescribed an antibiotic medicine, finish it all even if you start to feel better.  Avoid smoking and secondhand smoke. Exposure to cigarette smoke or irritating chemicals will make bronchitis worse. If you are a smoker, consider using nicotine gum or skin patches to help control withdrawal symptoms. Quitting smoking will help your lungs heal faster.   Reduce the chances of another bout of acute bronchitis by washing your hands frequently, avoiding people with cold symptoms, and trying not to touch your hands to your mouth, nose, or eyes.   Keep all follow-up visits as directed by your health care provider.  SEEK MEDICAL CARE IF: Your symptoms do not improve after 1 week of treatment.  SEEK IMMEDIATE MEDICAL CARE IF:  You develop an increased fever or chills.   You have chest pain.   You have severe shortness of breath.  You have bloody sputum.   You develop dehydration.  You faint or repeatedly feel like you are going to pass out.  You develop repeated vomiting.  You develop a severe headache. MAKE SURE YOU:   Understand these instructions.  Will watch your condition.  Will get help right away if you are not doing well or get worse.   This information is not intended to replace advice given to you by your health care provider. Make sure you discuss any questions you have with your health care provider.   Document Released: 01/27/2004 Document Revised: 01/09/2014 Document Reviewed: 06/11/2012 Elsevier Interactive Patient Education 2016 Elsevier Inc.  - Take meds as prescribed - Use a cool mist humidifier  -Use saline nose sprays frequently -Saline   irrigations of the nose can be very helpful if done frequently.  * 4X daily for 1 week*  * Use of a nettie pot can be helpful with this. Follow directions with this* -Force fluids -For any cough or congestion  Use plain Mucinex- regular strength or max strength is fine   * Children- consult with Pharmacist for  dosing -For fever or aces or pains- take tylenol or ibuprofen appropriate for age and weight.  * for fevers greater than 101 orally you may alternate ibuprofen and tylenol every  3 hours. -Throat lozenges if help    Christy Hawks, FNP  

## 2015-06-30 ENCOUNTER — Telehealth: Payer: Self-pay | Admitting: Nurse Practitioner

## 2015-07-01 NOTE — Telephone Encounter (Signed)
Advised pt of provider feedback and voiced understanding.

## 2015-07-01 NOTE — Telephone Encounter (Signed)
Patient was given levaquin which is a very strong antibiotic- if that did not help then it is a viral cough and will just have to run its course.

## 2015-08-25 ENCOUNTER — Other Ambulatory Visit: Payer: Self-pay | Admitting: Nurse Practitioner

## 2015-08-25 DIAGNOSIS — J301 Allergic rhinitis due to pollen: Secondary | ICD-10-CM

## 2015-09-07 DIAGNOSIS — M79671 Pain in right foot: Secondary | ICD-10-CM | POA: Diagnosis not present

## 2015-09-07 DIAGNOSIS — M722 Plantar fascial fibromatosis: Secondary | ICD-10-CM | POA: Diagnosis not present

## 2015-10-04 DIAGNOSIS — H4720 Unspecified optic atrophy: Secondary | ICD-10-CM | POA: Diagnosis not present

## 2015-10-04 DIAGNOSIS — G932 Benign intracranial hypertension: Secondary | ICD-10-CM | POA: Diagnosis not present

## 2015-10-05 ENCOUNTER — Encounter: Payer: Self-pay | Admitting: Nurse Practitioner

## 2015-10-05 ENCOUNTER — Telehealth: Payer: Self-pay | Admitting: Nurse Practitioner

## 2015-10-05 NOTE — Telephone Encounter (Signed)
Pt notified letter has been mailed

## 2015-10-05 NOTE — Telephone Encounter (Signed)
Letter has been written and signed to be mailed.

## 2015-10-11 DIAGNOSIS — G4733 Obstructive sleep apnea (adult) (pediatric): Secondary | ICD-10-CM | POA: Diagnosis not present

## 2015-10-13 ENCOUNTER — Other Ambulatory Visit: Payer: Self-pay | Admitting: Nurse Practitioner

## 2015-10-26 ENCOUNTER — Other Ambulatory Visit: Payer: Self-pay | Admitting: Nurse Practitioner

## 2015-10-26 DIAGNOSIS — I10 Essential (primary) hypertension: Secondary | ICD-10-CM

## 2015-12-29 DIAGNOSIS — M6702 Short Achilles tendon (acquired), left ankle: Secondary | ICD-10-CM | POA: Diagnosis not present

## 2015-12-29 DIAGNOSIS — M722 Plantar fascial fibromatosis: Secondary | ICD-10-CM | POA: Diagnosis not present

## 2016-01-17 DIAGNOSIS — G4733 Obstructive sleep apnea (adult) (pediatric): Secondary | ICD-10-CM | POA: Diagnosis not present

## 2016-01-23 ENCOUNTER — Other Ambulatory Visit: Payer: Self-pay | Admitting: Nurse Practitioner

## 2016-01-23 DIAGNOSIS — I10 Essential (primary) hypertension: Secondary | ICD-10-CM

## 2016-01-24 NOTE — Telephone Encounter (Signed)
Last refill without being seen 

## 2016-01-25 ENCOUNTER — Other Ambulatory Visit: Payer: Self-pay | Admitting: Nurse Practitioner

## 2016-04-25 ENCOUNTER — Other Ambulatory Visit: Payer: Self-pay | Admitting: Nurse Practitioner

## 2016-04-25 DIAGNOSIS — I10 Essential (primary) hypertension: Secondary | ICD-10-CM

## 2017-03-31 MED ORDER — GABAPENTIN 250 MG/5ML PO SOLN
125.00 | ORAL | Status: DC
Start: 2017-03-29 — End: 2017-03-31

## 2017-03-31 MED ORDER — CLOTRIMAZOLE 10 MG MT LOZG
LOZENGE | OROMUCOSAL | Status: DC
Start: ? — End: 2017-03-31

## 2017-03-31 MED ORDER — GENERIC EXTERNAL MEDICATION
.08 | Status: DC
Start: ? — End: 2017-03-31

## 2017-03-31 MED ORDER — HYDRALAZINE HCL 25 MG PO TABS
25.00 | ORAL_TABLET | ORAL | Status: DC
Start: ? — End: 2017-03-31

## 2017-03-31 MED ORDER — CLONIDINE HCL 0.1 MG PO TABS
.10 | ORAL_TABLET | ORAL | Status: DC
Start: ? — End: 2017-03-31

## 2017-03-31 MED ORDER — GEMFIBROZIL 600 MG PO TABS
600.00 | ORAL_TABLET | ORAL | Status: DC
Start: 2017-03-29 — End: 2017-03-31

## 2017-03-31 MED ORDER — HYDROMORPHONE HCL 1 MG/ML IJ SOLN
0.20 | INTRAMUSCULAR | Status: DC
Start: ? — End: 2017-03-31

## 2017-03-31 MED ORDER — SIMETHICONE 40 MG/0.6ML PO SUSP
80.00 | ORAL | Status: DC
Start: ? — End: 2017-03-31

## 2017-03-31 MED ORDER — SODIUM CHLORIDE 0.9 % IV SOLN
1000.00 | INTRAVENOUS | Status: DC
Start: ? — End: 2017-03-31

## 2017-03-31 MED ORDER — GENERIC EXTERNAL MEDICATION
Status: DC
Start: ? — End: 2017-03-31

## 2017-03-31 MED ORDER — HEPARIN SODIUM (PORCINE) 5000 UNIT/ML IJ SOLN
INTRAMUSCULAR | Status: DC
Start: 2017-03-29 — End: 2017-03-31

## 2017-03-31 MED ORDER — CELECOXIB 200 MG PO CAPS
200.00 | ORAL_CAPSULE | ORAL | Status: DC
Start: 2017-03-29 — End: 2017-03-31

## 2017-03-31 MED ORDER — GENERIC EXTERNAL MEDICATION
.04 | Status: DC
Start: ? — End: 2017-03-31

## 2017-03-31 MED ORDER — POTASSIUM CHLORIDE IN NACL 20-0.9 MEQ/L-% IV SOLN
INTRAVENOUS | Status: DC
Start: ? — End: 2017-03-31

## 2017-03-31 MED ORDER — ONDANSETRON HCL 4 MG PO TABS
4.00 | ORAL_TABLET | ORAL | Status: DC
Start: ? — End: 2017-03-31

## 2017-03-31 MED ORDER — PANTOPRAZOLE SODIUM 40 MG PO TBEC
40.00 | DELAYED_RELEASE_TABLET | ORAL | Status: DC
Start: 2017-03-29 — End: 2017-03-31

## 2017-03-31 MED ORDER — ONDANSETRON HCL 4 MG/2ML IJ SOLN
8.00 | INTRAMUSCULAR | Status: DC
Start: ? — End: 2017-03-31

## 2017-03-31 MED ORDER — SUCRALFATE 1 GM/10ML PO SUSP
1.00 | ORAL | Status: DC
Start: 2017-03-29 — End: 2017-03-31

## 2018-01-05 ENCOUNTER — Emergency Department (HOSPITAL_COMMUNITY): Payer: Medicare Other

## 2018-01-05 ENCOUNTER — Emergency Department (HOSPITAL_COMMUNITY)
Admission: EM | Admit: 2018-01-05 | Discharge: 2018-01-05 | Disposition: A | Discharge status: Home / Self Care | Payer: Medicare Other | Attending: Emergency Medicine | Admitting: Emergency Medicine

## 2018-01-05 ENCOUNTER — Encounter (HOSPITAL_COMMUNITY): Payer: Self-pay | Admitting: Emergency Medicine

## 2018-01-05 DIAGNOSIS — N201 Calculus of ureter: Secondary | ICD-10-CM | POA: Diagnosis not present

## 2018-01-05 DIAGNOSIS — Z79899 Other long term (current) drug therapy: Secondary | ICD-10-CM | POA: Diagnosis not present

## 2018-01-05 DIAGNOSIS — R1084 Generalized abdominal pain: Secondary | ICD-10-CM | POA: Diagnosis present

## 2018-01-05 LAB — URINALYSIS, ROUTINE W REFLEX MICROSCOPIC
Bacteria, UA: NONE SEEN
Bilirubin Urine: NEGATIVE
Glucose, UA: NEGATIVE mg/dL
Ketones, ur: 20 mg/dL — AB
Leukocytes, UA: NEGATIVE
Nitrite: NEGATIVE
PH: 9 — AB (ref 5.0–8.0)
Protein, ur: NEGATIVE mg/dL
RBC / HPF: >50 RBC/hpf — ABNORMAL HIGH (ref 0–5)
Specific Gravity, Urine: 1.011 (ref 1.005–1.030)

## 2018-01-05 LAB — CBC WITH DIFFERENTIAL/PLATELET
Abs Immature Granulocytes: 0.01 10*3/uL (ref 0.00–0.07)
Basophils Absolute: 0 10*3/uL (ref 0.0–0.1)
Basophils Relative: 1 %
EOS ABS: 0.1 10*3/uL (ref 0.0–0.5)
Eosinophils Relative: 1 %
HCT: 39.3 % (ref 36.0–46.0)
Hemoglobin: 12.9 g/dL (ref 12.0–15.0)
Immature Granulocytes: 0 %
Lymphocytes Relative: 33 %
Lymphs Abs: 2.1 10*3/uL (ref 0.7–4.0)
MCH: 29.4 pg (ref 26.0–34.0)
MCHC: 32.8 g/dL (ref 30.0–36.0)
MCV: 89.5 fL (ref 80.0–100.0)
Monocytes Absolute: 0.5 10*3/uL (ref 0.1–1.0)
Monocytes Relative: 7 %
NEUTROS PCT: 58 %
Neutro Abs: 3.8 10*3/uL (ref 1.7–7.7)
Platelets: 225 10*3/uL (ref 150–400)
RBC: 4.39 MIL/uL (ref 3.87–5.11)
RDW: 12.9 % (ref 11.5–15.5)
WBC: 6.5 10*3/uL (ref 4.0–10.5)
nRBC: 0 % (ref 0.0–0.2)

## 2018-01-05 LAB — COMPREHENSIVE METABOLIC PANEL
ALT: 31 U/L (ref 0–44)
AST: 26 U/L (ref 15–41)
Albumin: 3.7 g/dL (ref 3.5–5.0)
Alkaline Phosphatase: 122 U/L (ref 38–126)
Anion gap: 9 (ref 5–15)
BUN: 15 mg/dL (ref 8–23)
CO2: 25 mmol/L (ref 22–32)
Calcium: 8.9 mg/dL (ref 8.9–10.3)
Chloride: 107 mmol/L (ref 98–111)
Creatinine, Ser: 0.75 mg/dL (ref 0.44–1.00)
GFR calc Af Amer: >60 mL/min (ref >60)
Glucose, Bld: 109 mg/dL — ABNORMAL HIGH (ref 70–99)
Potassium: 3.6 mmol/L (ref 3.5–5.1)
Sodium: 141 mmol/L (ref 135–145)
Total Bilirubin: 0.6 mg/dL (ref 0.3–1.2)
Total Protein: 7 g/dL (ref 6.5–8.1)

## 2018-01-05 MED ORDER — ONDANSETRON HCL 4 MG/2ML IJ SOLN
4.0000 mg | Freq: Once | INTRAMUSCULAR | Status: AC
  Administered 2018-01-05: 4 mg via INTRAVENOUS
  Filled 2018-01-05: qty 2

## 2018-01-05 MED ORDER — HYDROCODONE-ACETAMINOPHEN 5-325 MG PO TABS: 1.0000 | ORAL_TABLET | Freq: Four times a day (QID) | ORAL | 0 refills | Status: DC | PRN

## 2018-01-05 MED ORDER — HYDROMORPHONE HCL 1 MG/ML IJ SOLN
1.0000 mg | Freq: Once | INTRAMUSCULAR | Status: AC
  Administered 2018-01-05: 1 mg via INTRAVENOUS
  Filled 2018-01-05: qty 1

## 2018-01-05 MED ORDER — ONDANSETRON HCL 4 MG PO TABS: 4.0000 mg | ORAL_TABLET | Freq: Four times a day (QID) | ORAL | 0 refills | Status: DC | PRN

## 2018-01-05 NOTE — ED Provider Notes (Signed)
Discussed CT results with patient and her husband.  They will follow-up with [facility].  CD of CT renal given to pt.   Nat Christen, MD 01/05/18 [phone]

## 2018-01-05 NOTE — ED Triage Notes (Signed)
Pt states she has been having left flank pain for about 45 minutes along with nausea/vomiting.

## 2018-01-05 NOTE — ED Notes (Signed)
Obtaining cd of scan for pt to take to [facility].

## 2018-01-05 NOTE — Discharge Instructions (Addendum)
You have a kidney stone on the left side.  Prescriptions for pain and nausea medication.  Follow-up with urology if not improving.  Urine will be sent to the lab for a culture to see if any bacteria are growing.

## 2018-01-05 NOTE — ED Provider Notes (Addendum)
[facility] Provider Note   CSN: [id number] Arrival date & time: 01/05/18  1229     History   Chief Complaint Chief Complaint  Patient presents with  . Flank Pain    HPI Rhonda Ruiz is a 63 y.o. female.  HPI Patient presents with acute onset left flank pain radiating to her left abdomen for the past hour.  Describes the pain as sharp.  Associated with nausea and episode of vomiting.  States she is noticed some hematuria.  States he has had subjective fevers and chills.  She has a history of prior renal stones and states the symptoms are similar. Past Medical History:  Diagnosis Date  . Anemia    hx-before hysterectomy  . Arthritis   . Headache(784.0)   . Hyperlipidemia   . Intracranial hypertension    See note under "Tumor of optic nerve".  . Pneumonia    hx  . Shortness of breath   . Sleep apnea    cpap 1-2 yrs  . 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    Patient Active Problem List   Diagnosis Date Noted  . Hyperlipidemia with target LDL less than 100 02/05/2014  . Legally blind 02/05/2014  . Morbid obesity ([place]) 02/05/2014  . Osteoarthrosis, unspecified whether generalized or localized, shoulder region 05/30/2013  . HYPERTENSION, BENIGN 08/24/2008  . FATIGUE 08/24/2008  . SINUS TACHYCARDIA 08/21/2008    Past Surgical History:  Procedure Laterality Date  . ABDOMINAL HYSTERECTOMY    . CESAREAN SECTION     x2  . CHOLECYSTECTOMY    . EYE SURGERY Bilateral    optic sheath  . GASTRIC BYPASS    . HERNIA REPAIR     abd hernia  . JOINT REPLACEMENT Right 10   hip  . REVERSE SHOULDER ARTHROPLASTY Left 05/30/2013   DR NORRIS  . REVERSE SHOULDER ARTHROPLASTY Left 05/30/2013   Procedure: LEFT REVERSE SHOULDER ARTHROPLASTY;  Surgeon: [name], MD;  Location: [facility];  Service: Orthopedics;  Laterality:  Left;     OB History   No obstetric history on file.      Home Medications    Prior to Admission medications   Medication Sig Start Date End Date Taking? Authorizing Provider  Biotin 10 MG CAPS Take 1 capsule by mouth daily.   Yes [provider]  Cyanocobalamin (VITAMIN B12 PO) Take 1 tablet by mouth daily.   Yes [provider]  fluticasone Asencion Islam) 50 MCG/ACT nasal spray Use 2 sprays in each  nostril daily 08/25/15  Yes Hassell Done, Mary-Margaret, FNP  Multiple Vitamins-Minerals (PRESERVISION AREDS 2+MULTI VIT) CAPS Take 1 capsule by mouth 2 (two) times daily.    Yes [provider]  omeprazole (PRILOSEC) 20 MG capsule Take 1 capsule by mouth daily as needed.  04/30/17  Yes [provider]  tamsulosin (FLOMAX) 0.4 MG CAPS capsule Take 1 capsule by mouth daily as needed.  11/15/17  Yes [provider]  HYDROcodone-acetaminophen (NORCO) 5-325 MG tablet Take 1 tablet by mouth every 6 (six) hours as needed for severe pain. 01/05/18   Julianne Rice, MD  ondansetron (ZOFRAN) 4 MG tablet Take 1 tablet (4 mg total) by mouth every 6 (six) hours as needed for nausea or vomiting. 01/05/18   Julianne Rice, MD    Family History Family History  Problem Relation Age of Onset  . Diabetes Father   . Heart attack Father   . Heart attack Brother     Social History Social History   Tobacco Use  . Smoking status: Never Smoker  . Smokeless tobacco: Never Used  Substance Use Topics  . Alcohol use: No  . Drug use: No     Allergies   Aspirin; Prednisone; Other; Tape; and Tetracyclines & related   Review of Systems Review of Systems  Constitutional: Negative for chills and fever.  Respiratory: Negative for shortness of breath.   Cardiovascular: Negative for chest pain.  Gastrointestinal: Positive for abdominal pain.     Physical Exam Updated Vital Signs BP 136/71 (BP Location: Right Arm)   Pulse 60   Temp 97.7 F (36.5 C)   Resp 20    SpO2 100%   Physical Exam Vitals signs and nursing note reviewed.  Constitutional:      General: She is in acute distress.     Appearance: She is well-developed.  HENT:     Head: Normocephalic and atraumatic.     Nose: Nose normal.     Mouth/Throat:     Mouth: Mucous membranes are moist.     Pharynx: No oropharyngeal exudate or posterior oropharyngeal erythema.  Eyes:     Extraocular Movements: Extraocular movements intact.     Pupils: Pupils are equal, round, and reactive to light.  Neck:     Musculoskeletal: Normal range of motion and neck supple. No neck rigidity or muscular tenderness.  Cardiovascular:     Rate and Rhythm: Normal rate and regular rhythm.     Heart sounds: No murmur. No friction rub. No gallop.   Pulmonary:     Effort: Pulmonary effort is normal.     Breath sounds: Normal breath sounds.  Abdominal:     General: Bowel sounds are normal.     Palpations: Abdomen is soft.     Tenderness: There is no abdominal tenderness. There is no guarding or rebound.  Musculoskeletal: Normal range of motion.        General: No swelling, tenderness, deformity or signs of injury.     Comments: Mild left CVA tenderness to percussion.  No midline thoracic lumbar tenderness.  No lower extremity swelling, asymmetry or tenderness.  Lymphadenopathy:     Cervical: No cervical adenopathy.  Skin:    General: Skin is warm and dry.     Capillary Refill: Capillary refill takes less than 2 seconds.     Findings: No erythema or rash.  Neurological:     General: No focal deficit present.     Mental Status: She is alert and oriented to person, place, and time.  Psychiatric:        Mood and Affect: Mood normal.        Behavior: Behavior normal.      ED Treatments / Results  Labs (all labs ordered are listed, but only abnormal results are displayed) Labs Reviewed  COMPREHENSIVE METABOLIC PANEL - Abnormal; Notable for the following components:      Result Value   Glucose, Bld 109  (*)    All other components within normal limits  URINALYSIS, ROUTINE W REFLEX MICROSCOPIC - Abnormal; Notable for the following components:   pH 9.0 (*)    Hgb urine dipstick SMALL (*)    Ketones, ur 20 (*)    RBC / HPF >50 (*)    All other components within normal limits  URINE CULTURE  CBC WITH  DIFFERENTIAL/PLATELET    EKG None  Radiology No results found.  Procedures Procedures (including critical care time)  Medications Ordered in ED Medications  ondansetron (ZOFRAN) injection 4 mg (4 mg Intravenous Given 01/05/18 1259)  HYDROmorphone (DILAUDID) injection 1 mg (1 mg Intravenous Given 01/05/18 1259)  ondansetron (ZOFRAN) injection 4 mg (4 mg Intravenous Given 01/05/18 1521)     Initial Impression / Assessment and Plan / ED Course  I have reviewed the triage vital signs and the nursing notes.  Pertinent labs & imaging results that were available during my care of the patient were reviewed by me and considered in my medical decision making (see chart for details).     Pain is significantly better after IV medication.  UA pending.  Signed out to oncoming emergency provider.  Anticipate discharge home.  Final Clinical Impressions(s) / ED Diagnoses   Final diagnoses:  Left ureteral stone    ED Discharge Orders         Ordered    HYDROcodone-acetaminophen (NORCO) 5-325 MG tablet  Every 6 hours PRN     01/05/18 1425    ondansetron (ZOFRAN) 4 MG tablet  Every 6 hours PRN     01/05/18 1425           Julianne Rice, MD 01/07/18 1733    Julianne Rice, MD 01/28/18 2329

## 2018-01-08 LAB — URINE CULTURE: Culture: NO GROWTH

## 2018-01-21 ENCOUNTER — Emergency Department (HOSPITAL_COMMUNITY): Payer: Medicare Other

## 2018-01-21 ENCOUNTER — Encounter (HOSPITAL_COMMUNITY): Payer: Self-pay | Admitting: *Deleted

## 2018-01-21 ENCOUNTER — Other Ambulatory Visit: Payer: Self-pay

## 2018-01-21 ENCOUNTER — Emergency Department (HOSPITAL_COMMUNITY)
Admission: EM | Admit: 2018-01-21 | Discharge: 2018-01-21 | Disposition: A | Discharge status: Home / Self Care | Payer: Medicare Other | Attending: Emergency Medicine | Admitting: Emergency Medicine

## 2018-01-21 DIAGNOSIS — R109 Unspecified abdominal pain: Secondary | ICD-10-CM | POA: Diagnosis present

## 2018-01-21 DIAGNOSIS — Z96641 Presence of right artificial hip joint: Secondary | ICD-10-CM | POA: Insufficient documentation

## 2018-01-21 DIAGNOSIS — I10 Essential (primary) hypertension: Secondary | ICD-10-CM | POA: Diagnosis not present

## 2018-01-21 DIAGNOSIS — N201 Calculus of ureter: Secondary | ICD-10-CM | POA: Diagnosis not present

## 2018-01-21 DIAGNOSIS — N23 Unspecified renal colic: Secondary | ICD-10-CM

## 2018-01-21 DIAGNOSIS — Z79899 Other long term (current) drug therapy: Secondary | ICD-10-CM | POA: Diagnosis not present

## 2018-01-21 LAB — URINALYSIS, ROUTINE W REFLEX MICROSCOPIC
Bilirubin Urine: NEGATIVE
Glucose, UA: NEGATIVE mg/dL
Ketones, ur: NEGATIVE mg/dL
Nitrite: NEGATIVE
Protein, ur: 30 mg/dL — AB
RBC / HPF: >50 RBC/hpf — ABNORMAL HIGH (ref 0–5)
Specific Gravity, Urine: 1.013 (ref 1.005–1.030)
pH: 5 (ref 5.0–8.0)

## 2018-01-21 MED ORDER — HYDROCODONE-ACETAMINOPHEN 5-325 MG PO TABS: 2.0000 | ORAL_TABLET | ORAL | 0 refills | Status: DC | PRN

## 2018-01-21 MED ORDER — ONDANSETRON HCL 4 MG/2ML IJ SOLN
4.0000 mg | Freq: Once | INTRAMUSCULAR | Status: AC
  Administered 2018-01-21: 4 mg via INTRAVENOUS
  Filled 2018-01-21: qty 2

## 2018-01-21 MED ORDER — HYDROCODONE-ACETAMINOPHEN 5-325 MG PO TABS
1.0000 | ORAL_TABLET | Freq: Once | ORAL | Status: AC
  Administered 2018-01-21: 1 via ORAL
  Filled 2018-01-21: qty 1

## 2018-01-21 MED ORDER — HYDROMORPHONE HCL 1 MG/ML IJ SOLN
1.0000 mg | Freq: Once | INTRAMUSCULAR | Status: AC
  Administered 2018-01-21: 1 mg via INTRAVENOUS
  Filled 2018-01-21: qty 1

## 2018-01-21 NOTE — ED Triage Notes (Signed)
Patient left flank pain beginning today, with history of kidney stones.

## 2018-01-21 NOTE — ED Provider Notes (Signed)
[facility] EMERGENCY DEPARTMENT Provider Note   CSN: [id number] Arrival date & time: 01/21/18  1159     History   Chief Complaint Chief Complaint  Patient presents with  . Flank Pain    left    HPI Rhonda Ruiz is a 63 y.o. female.  HPI   She presents for evaluation of recurrent pain left flank felt to be from kidney stone.  She is scheduled for outpatient cystoscopy, in 2 weeks.  Previously diagnosed with a large left UVJ, and small left renal stone, about 2.5 weeks ago.  She followed up with her urologist.  She feels like she might have passed a small stone, about a week ago.  In the last day she has noticed hematuria.  She denies fever, chills, vomiting, dizziness, chest pain, back pain, weakness or paresthesia.  There are no other known modifying factors.  Past Medical History:  Diagnosis Date  . Anemia    hx-before hysterectomy  . Arthritis   . Headache(784.0)   . Hyperlipidemia   . Intracranial hypertension    See note under "Tumor of optic nerve".  . Pneumonia    hx  . Shortness of breath   . Sleep apnea    cpap 1-2 yrs  . 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    Patient Active Problem List   Diagnosis Date Noted  . Hyperlipidemia with target LDL less than 100 02/05/2014  . Legally blind 02/05/2014  . Morbid obesity ([place]) 02/05/2014  . Osteoarthrosis, unspecified whether generalized or localized, shoulder region 05/30/2013  . HYPERTENSION, BENIGN 08/24/2008  . FATIGUE 08/24/2008  . SINUS TACHYCARDIA 08/21/2008    Past Surgical History:  Procedure Laterality Date  . ABDOMINAL HYSTERECTOMY    . CESAREAN SECTION     x2  . CHOLECYSTECTOMY    . EYE SURGERY Bilateral    optic sheath  . GASTRIC BYPASS    . HERNIA REPAIR     abd hernia  . JOINT REPLACEMENT Right 10   hip  . REVERSE SHOULDER ARTHROPLASTY Left  05/30/2013   DR NORRIS  . REVERSE SHOULDER ARTHROPLASTY Left 05/30/2013   Procedure: LEFT REVERSE SHOULDER ARTHROPLASTY;  Surgeon: [name], MD;  Location: [facility];  Service: Orthopedics;  Laterality: Left;     OB History   No obstetric history on file.      Home Medications    Prior to Admission medications   Medication Sig Start Date End Date Taking? Authorizing Provider  Biotin 10 MG CAPS Take 1 capsule by mouth daily.    [provider]  Cyanocobalamin (VITAMIN B12 PO) Take 1 tablet by mouth daily.    [provider]  fluticasone Asencion Islam) 50 MCG/ACT nasal spray Use 2 sprays in each  nostril daily 08/25/15   Chevis Pretty, FNP  HYDROcodone-acetaminophen (NORCO) 5-325 MG tablet Take 2 tablets by mouth every 4 (four) hours as needed for moderate pain. 01/21/18   Daleen Bo, MD  lisinopril (PRINIVIL,ZESTRIL) 5 MG tablet Take 1 tablet by mouth daily. 01/17/18   [provider]  Multiple Vitamins-Minerals (PRESERVISION AREDS 2+MULTI VIT) CAPS Take 1 capsule by mouth 2 (two) times daily.     [provider]  omeprazole (PRILOSEC) 20 MG capsule Take 1 capsule by mouth daily as needed.  04/30/17   [provider]  ondansetron (ZOFRAN) 4 MG tablet Take 1 tablet (4 mg total) by mouth every 6 (six) hours as needed for nausea or vomiting. 01/05/18   Julianne Rice, MD  tamsulosin (FLOMAX) 0.4 MG CAPS capsule Take 1 capsule by mouth daily as needed.  11/15/17   [provider]    Family History Family History  Problem Relation Age of Onset  . Diabetes Father   . Heart attack Father   . Heart attack Brother     Social History Social History   Tobacco Use  . Smoking status: Never Smoker  . Smokeless tobacco: Never Used  Substance Use Topics  . Alcohol use: No  . Drug use: No     Allergies   Aspirin; Prednisone; Other; Tape; and Tetracyclines & related   Review of Systems Review of Systems  All other systems  reviewed and are negative.    Physical Exam Updated Vital Signs BP 126/62   Pulse 74   Temp 98.3 F (36.8 C) (Oral)   Resp 16   Ht 4\' 9"  (1.448 m)   Wt 78.9 kg   SpO2 96%   BMI 37.65 kg/m   Physical Exam Vitals signs and nursing note reviewed.  Constitutional:      General: She is in acute distress (Uncomfortable).     Appearance: She is well-developed. She is ill-appearing. She is not toxic-appearing or diaphoretic.  HENT:     Head: Normocephalic and atraumatic.     Right Ear: External ear normal.     Left Ear: External ear normal.  Eyes:     Conjunctiva/sclera: Conjunctivae normal.     Pupils: Pupils are equal, round, and reactive to light.  Neck:     Musculoskeletal: Normal range of motion and neck supple.     Trachea: Phonation normal.  Cardiovascular:     Rate and Rhythm: Normal rate and regular rhythm.     Heart sounds: Normal heart sounds.  Pulmonary:     Effort: Pulmonary effort is normal.     Breath sounds: Normal breath sounds.  Abdominal:     Palpations: Abdomen is soft.     Tenderness: There is no abdominal tenderness.  Musculoskeletal: Normal range of motion.  Skin:    General: Skin is warm and dry.  Neurological:     Mental Status: She is alert and oriented to person, place, and time.     Cranial Nerves: No cranial nerve deficit.     Sensory: No sensory deficit.     Motor: No abnormal muscle tone.     Coordination: Coordination normal.  Psychiatric:        Behavior: Behavior normal.        Thought Content: Thought content normal.        Judgment: Judgment normal.      ED Treatments / Results  Labs (all labs ordered are listed, but only abnormal results are displayed) Labs Reviewed  URINALYSIS, ROUTINE W REFLEX MICROSCOPIC - Abnormal; Notable for the following components:      Result Value   Color, Urine AMBER (*)    APPearance HAZY (*)    Hgb urine dipstick LARGE (*)    Protein, ur 30 (*)    Leukocytes, UA TRACE (*)    RBC / HPF >50  (*)    Bacteria, UA RARE (*)    All other components within normal limits    EKG None  Radiology Ct Renal Stone Study  Result Date: 01/21/2018 CLINICAL DATA:  Left flank pain starting  today. Known bilateral kidney stones. EXAM: CT ABDOMEN AND PELVIS WITHOUT CONTRAST TECHNIQUE: Multidetector CT imaging of the abdomen and pelvis was performed following the standard protocol without IV contrast. COMPARISON:  01/05/2018 FINDINGS: Lower chest: Unremarkable. Hepatobiliary: Calcified granuloma noted lateral segment left liver. No focal abnormality in the liver on this study without intravenous contrast. Gallbladder surgically absent. No intrahepatic or extrahepatic biliary dilation. Pancreas: Subtle hypodense 11 x 17 mm focus in the uncinate process of the pancreas. No dilatation of the main pancreatic duct. Spleen: No splenomegaly. No focal mass lesion. Adrenals/Urinary Tract: No adrenal nodule or mass. Approximately 3 stones are seen in the right kidney without hydronephrosis. These range in size from 2 mm up to about 5 mm. No right ureteral stone although distal ureters obscured by streak artifact from right hip replacement. No right hydroureteronephrosis. 3 stones are noted in the left kidney. The dominant stone is in the left renal pelvis as before, measuring 16 x 8 x 17 mm. Mild fullness of the left intrarenal collecting system and renal pelvis again noted. Distal left ureter not well visualized due to streak artifact, but the distal left ureteral stone seen on the previous study appears to persist on image 71/series 2. Bladder is decompressed. Stomach/Bowel: Status post gastric surgery. Duodenum is normally positioned as is the ligament of Treitz. No small bowel wall thickening. No small bowel dilatation. The terminal ileum is normal. No gross colonic mass. No colonic wall thickening. Vascular/Lymphatic: There is abdominal aortic atherosclerosis without aneurysm. There is no gastrohepatic or  hepatoduodenal ligament lymphadenopathy. No intraperitoneal or retroperitoneal lymphadenopathy. No pelvic sidewall lymphadenopathy. Reproductive: Uterus appears to be surgically absent. Vaginal cuff region not well seen due to streak artifact. There is no adnexal mass. Other: No intraperitoneal free fluid. Musculoskeletal: Status post right total hip replacement. Patient is status post ventral mesh placement. No worrisome lytic or sclerotic osseous abnormality. IMPRESSION: 1. Stable appearance of bilateral urinary stone disease. As before, there appears to be a stone in the distal left ureter just proximal to the UVJ although this area is not well seen due to beam hardening artifact. There is mild fullness in the left ureter and intrarenal collecting system. 2. 11 x 17 mm focus of subtle hypo attenuation identified in the uncinate process of the pancreas. No associated dilatation of the main pancreatic duct. This may be related to focal fatty deposition, but MRI of the abdomen without and with contrast recommended to further evaluate. 3.  Aortic Atherosclerois (ICD10-170.0) Electronically Signed   By: Misty Stanley M.D.   On: 01/21/2018 15:26    Procedures Procedures (including critical care time)  Medications Ordered in ED Medications  ondansetron (ZOFRAN) injection 4 mg (4 mg Intravenous Given 01/21/18 1326)  HYDROmorphone (DILAUDID) injection 1 mg (1 mg Intravenous Given 01/21/18 1325)  HYDROcodone-acetaminophen (NORCO/VICODIN) 5-325 MG per tablet 1 tablet (1 tablet Oral Given 01/21/18 1700)     Initial Impression / Assessment and Plan / ED Course  I have reviewed the triage vital signs and the nursing notes.  Pertinent labs & imaging results that were available during my care of the patient were reviewed by me and considered in my medical decision making (see chart for details).  Clinical Course as of Jan 22 1345  Tue Jan 22, 2018  1346 Abnormal, large blood, protein and greater than 50 RBCs.    Urinalysis, Routine w reflex microscopic(!) [EW]    Clinical Course User Index [EW] Daleen Bo, MD     Patient Vitals  for the past 24 hrs:  BP Pulse Resp SpO2  01/21/18 1545 126/62 74 16 96 %    At discharge- reevaluation with update and discussion. After initial assessment and treatment, an updated evaluation reveals pain is controlled and she has no further complaints.  Findings discussed and all questions answered.  Optical disc with images given.Daleen Bo   Medical Decision Making: Left flank pain, with retained distal ureter stone, unchanged from imaging several weeks ago.  No evidence for acute infectious process or metabolic instability.  CRITICAL CARE-no Performed by: Daleen Bo  Nursing Notes Reviewed/ Care Coordinated Applicable Imaging Reviewed Interpretation of Laboratory Data incorporated into ED treatment  The patient appears reasonably screened and/or stabilized for discharge and I doubt any other medical condition or other [facility] requiring further screening, evaluation, or treatment in the ED at this time prior to discharge.  Plan: Home Medications-continue usual medications; Home Treatments-strain urine; return here if the recommended treatment, does not improve the symptoms; Recommended follow up-urology follow-up as soon as possible for further care and treatment.   Final Clinical Impressions(s) / ED Diagnoses   Final diagnoses:  Ureteral colic  Left ureteral stone    ED Discharge Orders         Ordered    HYDROcodone-acetaminophen (NORCO) 5-325 MG tablet  Every 4 hours PRN     01/21/18 1635           Daleen Bo, MD 01/22/18 1347

## 2018-01-21 NOTE — Discharge Instructions (Signed)
Call your urologist in the morning to report that your 4 mm stone in the left ureter, is still present, at the UVJ.  This means it is close to the bladder but has not fallen into it yet.  Continue taking your pain medication.

## 2018-12-10 ENCOUNTER — Other Ambulatory Visit: Payer: Self-pay | Admitting: Urology

## 2018-12-10 ENCOUNTER — Emergency Department (HOSPITAL_COMMUNITY)
Admission: EM | Admit: 2018-12-10 | Discharge: 2018-12-10 | Disposition: A | Discharge status: Home / Self Care | Payer: Medicare Other | Source: Home / Self Care | Attending: Emergency Medicine | Admitting: Emergency Medicine

## 2018-12-10 ENCOUNTER — Encounter (HOSPITAL_COMMUNITY): Payer: Self-pay

## 2018-12-10 ENCOUNTER — Encounter (HOSPITAL_COMMUNITY)
Admission: RE | Disposition: A | Discharge status: Home / Self Care | Payer: Self-pay | Source: Ambulatory Visit | Attending: Urology

## 2018-12-10 ENCOUNTER — Emergency Department (HOSPITAL_COMMUNITY): Payer: Medicare Other

## 2018-12-10 ENCOUNTER — Encounter (HOSPITAL_COMMUNITY): Payer: Self-pay | Admitting: *Deleted

## 2018-12-10 ENCOUNTER — Inpatient Hospital Stay (HOSPITAL_COMMUNITY): Payer: Medicare Other | Admitting: Certified Registered Nurse Anesthetist

## 2018-12-10 ENCOUNTER — Other Ambulatory Visit: Payer: Self-pay

## 2018-12-10 ENCOUNTER — Inpatient Hospital Stay (HOSPITAL_COMMUNITY): Payer: Medicare Other

## 2018-12-10 ENCOUNTER — Ambulatory Visit (HOSPITAL_COMMUNITY)
Admission: RE | Admit: 2018-12-10 | Discharge: 2018-12-10 | Disposition: A | Discharge status: Home / Self Care | Payer: Medicare Other | Source: Ambulatory Visit | Attending: Urology | Admitting: Urology

## 2018-12-10 DIAGNOSIS — N1330 Unspecified hydronephrosis: Secondary | ICD-10-CM

## 2018-12-10 DIAGNOSIS — Z881 Allergy status to other antibiotic agents status: Secondary | ICD-10-CM | POA: Insufficient documentation

## 2018-12-10 DIAGNOSIS — Z886 Allergy status to analgesic agent status: Secondary | ICD-10-CM | POA: Insufficient documentation

## 2018-12-10 DIAGNOSIS — Z6835 Body mass index (BMI) 35.0-35.9, adult: Secondary | ICD-10-CM | POA: Diagnosis not present

## 2018-12-10 DIAGNOSIS — I10 Essential (primary) hypertension: Secondary | ICD-10-CM | POA: Diagnosis not present

## 2018-12-10 DIAGNOSIS — Z20828 Contact with and (suspected) exposure to other viral communicable diseases: Secondary | ICD-10-CM | POA: Insufficient documentation

## 2018-12-10 DIAGNOSIS — N201 Calculus of ureter: Secondary | ICD-10-CM

## 2018-12-10 DIAGNOSIS — Z79899 Other long term (current) drug therapy: Secondary | ICD-10-CM | POA: Insufficient documentation

## 2018-12-10 DIAGNOSIS — H548 Legal blindness, as defined in USA: Secondary | ICD-10-CM | POA: Insufficient documentation

## 2018-12-10 DIAGNOSIS — R7989 Other specified abnormal findings of blood chemistry: Secondary | ICD-10-CM

## 2018-12-10 DIAGNOSIS — G4730 Sleep apnea, unspecified: Secondary | ICD-10-CM | POA: Diagnosis not present

## 2018-12-10 DIAGNOSIS — N132 Hydronephrosis with renal and ureteral calculous obstruction: Secondary | ICD-10-CM | POA: Diagnosis present

## 2018-12-10 HISTORY — PX: CYSTOSCOPY WITH RETROGRADE PYELOGRAM, URETEROSCOPY AND STENT PLACEMENT: SHX5789

## 2018-12-10 LAB — CBC WITH DIFFERENTIAL/PLATELET
Abs Immature Granulocytes: 0.03 10*3/uL (ref 0.00–0.07)
Basophils Absolute: 0 10*3/uL (ref 0.0–0.1)
Basophils Relative: 0 %
Eosinophils Absolute: 0 10*3/uL (ref 0.0–0.5)
Eosinophils Relative: 0 %
HCT: 39.3 % (ref 36.0–46.0)
Hemoglobin: 12.9 g/dL (ref 12.0–15.0)
Immature Granulocytes: 0 %
Lymphocytes Relative: 10 %
Lymphs Abs: 0.8 10*3/uL (ref 0.7–4.0)
MCH: 30.4 pg (ref 26.0–34.0)
MCHC: 32.8 g/dL (ref 30.0–36.0)
MCV: 92.7 fL (ref 80.0–100.0)
Monocytes Absolute: 0.3 10*3/uL (ref 0.1–1.0)
Monocytes Relative: 4 %
Neutro Abs: 7 10*3/uL (ref 1.7–7.7)
Neutrophils Relative %: 86 %
Platelets: 197 10*3/uL (ref 150–400)
RBC: 4.24 MIL/uL (ref 3.87–5.11)
RDW: 12.4 % (ref 11.5–15.5)
WBC: 8.2 10*3/uL (ref 4.0–10.5)
nRBC: 0 % (ref 0.0–0.2)

## 2018-12-10 LAB — URINALYSIS, ROUTINE W REFLEX MICROSCOPIC
Bacteria, UA: NONE SEEN
Bilirubin Urine: NEGATIVE
Glucose, UA: NEGATIVE mg/dL
Ketones, ur: NEGATIVE mg/dL
Leukocytes,Ua: NEGATIVE
Nitrite: NEGATIVE
Protein, ur: NEGATIVE mg/dL
RBC / HPF: >50 RBC/hpf — ABNORMAL HIGH (ref 0–5)
Specific Gravity, Urine: 1.009 (ref 1.005–1.030)
pH: 8 (ref 5.0–8.0)

## 2018-12-10 LAB — COMPREHENSIVE METABOLIC PANEL
ALT: 127 U/L — ABNORMAL HIGH (ref 0–44)
AST: 271 U/L — ABNORMAL HIGH (ref 15–41)
Albumin: 3.7 g/dL (ref 3.5–5.0)
Alkaline Phosphatase: 183 U/L — ABNORMAL HIGH (ref 38–126)
Anion gap: 11 (ref 5–15)
BUN: 15 mg/dL (ref 8–23)
CO2: 27 mmol/L (ref 22–32)
Calcium: 8.9 mg/dL (ref 8.9–10.3)
Chloride: 103 mmol/L (ref 98–111)
Creatinine, Ser: 0.68 mg/dL (ref 0.44–1.00)
GFR calc Af Amer: >60 mL/min (ref >60)
GFR calc non Af Amer: >60 mL/min (ref >60)
Glucose, Bld: 153 mg/dL — ABNORMAL HIGH (ref 70–99)
Potassium: 4.2 mmol/L (ref 3.5–5.1)
Sodium: 141 mmol/L (ref 135–145)
Total Bilirubin: 1.1 mg/dL (ref 0.3–1.2)
Total Protein: 6.8 g/dL (ref 6.5–8.1)

## 2018-12-10 LAB — RESPIRATORY PANEL BY RT PCR (FLU A&B, COVID)
Influenza A by PCR: NEGATIVE
Influenza B by PCR: NEGATIVE
SARS Coronavirus 2 by RT PCR: NEGATIVE

## 2018-12-10 LAB — LIPASE, BLOOD: Lipase: 31 U/L (ref 11–51)

## 2018-12-10 SURGERY — CYSTOURETEROSCOPY, WITH RETROGRADE PYELOGRAM AND STENT INSERTION
Anesthesia: General | Laterality: Bilateral

## 2018-12-10 MED ORDER — PROPOFOL 10 MG/ML IV BOLUS
INTRAVENOUS | Status: AC
  Filled 2018-12-10: qty 20

## 2018-12-10 MED ORDER — FENTANYL CITRATE (PF) 100 MCG/2ML IJ SOLN: 25.0000 ug | INTRAMUSCULAR | Status: DC | PRN

## 2018-12-10 MED ORDER — ONDANSETRON HCL 4 MG/2ML IJ SOLN: 4.0000 mg | Freq: Once | INTRAMUSCULAR | Status: DC | PRN

## 2018-12-10 MED ORDER — FENTANYL CITRATE (PF) 100 MCG/2ML IJ SOLN
INTRAMUSCULAR | Status: DC | PRN
  Administered 2018-12-10 (×2): 50 ug via INTRAVENOUS

## 2018-12-10 MED ORDER — HYDROCODONE-ACETAMINOPHEN 5-325 MG PO TABS: 1.0000 | ORAL_TABLET | ORAL | 0 refills | Status: DC | PRN

## 2018-12-10 MED ORDER — SUCCINYLCHOLINE CHLORIDE 200 MG/10ML IV SOSY
PREFILLED_SYRINGE | INTRAVENOUS | Status: AC
  Filled 2018-12-10: qty 10

## 2018-12-10 MED ORDER — CEFAZOLIN SODIUM-DEXTROSE 2-4 GM/100ML-% IV SOLN
2.0000 g | Freq: Once | INTRAVENOUS | Status: AC
  Administered 2018-12-10: 2 g via INTRAVENOUS
  Filled 2018-12-10: qty 100

## 2018-12-10 MED ORDER — ONDANSETRON HCL 4 MG/2ML IJ SOLN
INTRAMUSCULAR | Status: AC
  Filled 2018-12-10: qty 2

## 2018-12-10 MED ORDER — ONDANSETRON HCL 4 MG/2ML IJ SOLN
4.0000 mg | Freq: Once | INTRAMUSCULAR | Status: AC
  Administered 2018-12-10: 4 mg via INTRAVENOUS
  Filled 2018-12-10: qty 2

## 2018-12-10 MED ORDER — OXYCODONE HCL 5 MG PO TABS: 5.0000 mg | ORAL_TABLET | Freq: Once | ORAL | Status: DC | PRN

## 2018-12-10 MED ORDER — DEXAMETHASONE SODIUM PHOSPHATE 4 MG/ML IJ SOLN
INTRAMUSCULAR | Status: DC | PRN
  Administered 2018-12-10: 5 mg via INTRAVENOUS

## 2018-12-10 MED ORDER — SODIUM CHLORIDE 0.9 % IR SOLN
Status: DC | PRN
  Administered 2018-12-10: 1000 mL

## 2018-12-10 MED ORDER — OXYCODONE HCL 5 MG/5ML PO SOLN: 5.0000 mg | Freq: Once | ORAL | Status: DC | PRN

## 2018-12-10 MED ORDER — FENTANYL CITRATE (PF) 100 MCG/2ML IJ SOLN
INTRAMUSCULAR | Status: AC
  Filled 2018-12-10: qty 2

## 2018-12-10 MED ORDER — PROPOFOL 10 MG/ML IV BOLUS
INTRAVENOUS | Status: DC | PRN
  Administered 2018-12-10: 150 mg via INTRAVENOUS

## 2018-12-10 MED ORDER — ROCURONIUM BROMIDE 10 MG/ML (PF) SYRINGE
PREFILLED_SYRINGE | INTRAVENOUS | Status: DC | PRN
  Administered 2018-12-10: 40 mg via INTRAVENOUS

## 2018-12-10 MED ORDER — LIDOCAINE 2% (20 MG/ML) 5 ML SYRINGE
INTRAMUSCULAR | Status: DC | PRN
  Administered 2018-12-10: 80 mg via INTRAVENOUS

## 2018-12-10 MED ORDER — ROCURONIUM BROMIDE 10 MG/ML (PF) SYRINGE
PREFILLED_SYRINGE | INTRAVENOUS | Status: AC
  Filled 2018-12-10: qty 10

## 2018-12-10 MED ORDER — PHENYLEPHRINE 40 MCG/ML (10ML) SYRINGE FOR IV PUSH (FOR BLOOD PRESSURE SUPPORT)
PREFILLED_SYRINGE | INTRAVENOUS | Status: DC | PRN
  Administered 2018-12-10: 80 ug via INTRAVENOUS
  Administered 2018-12-10: 120 ug via INTRAVENOUS

## 2018-12-10 MED ORDER — DEXAMETHASONE SODIUM PHOSPHATE 10 MG/ML IJ SOLN
INTRAMUSCULAR | Status: AC
  Filled 2018-12-10: qty 1

## 2018-12-10 MED ORDER — IOHEXOL 300 MG/ML  SOLN
INTRAMUSCULAR | Status: DC | PRN
  Administered 2018-12-10: 10 mL

## 2018-12-10 MED ORDER — HYDROMORPHONE HCL 1 MG/ML IJ SOLN
0.5000 mg | Freq: Once | INTRAMUSCULAR | Status: AC
  Administered 2018-12-10: 0.5 mg via INTRAVENOUS
  Filled 2018-12-10: qty 1

## 2018-12-10 MED ORDER — LACTATED RINGERS IV SOLN
INTRAVENOUS | Status: DC
  Administered 2018-12-10: 18:00:00 via INTRAVENOUS

## 2018-12-10 MED ORDER — LIDOCAINE 2% (20 MG/ML) 5 ML SYRINGE
INTRAMUSCULAR | Status: AC
  Filled 2018-12-10: qty 5

## 2018-12-10 MED ORDER — ONDANSETRON HCL 4 MG/2ML IJ SOLN
INTRAMUSCULAR | Status: DC | PRN
  Administered 2018-12-10: 4 mg via INTRAVENOUS

## 2018-12-10 MED ORDER — MIDAZOLAM HCL 2 MG/2ML IJ SOLN
INTRAMUSCULAR | Status: AC
  Filled 2018-12-10: qty 2

## 2018-12-10 MED ORDER — MIDAZOLAM HCL 5 MG/5ML IJ SOLN
INTRAMUSCULAR | Status: DC | PRN
  Administered 2018-12-10: 2 mg via INTRAVENOUS

## 2018-12-10 MED ORDER — SUCCINYLCHOLINE CHLORIDE 200 MG/10ML IV SOSY
PREFILLED_SYRINGE | INTRAVENOUS | Status: DC | PRN
  Administered 2018-12-10: 100 mg via INTRAVENOUS

## 2018-12-10 MED ORDER — PHENYLEPHRINE 40 MCG/ML (10ML) SYRINGE FOR IV PUSH (FOR BLOOD PRESSURE SUPPORT)
PREFILLED_SYRINGE | INTRAVENOUS | Status: AC
  Filled 2018-12-10: qty 10

## 2018-12-10 SURGICAL SUPPLY — 23 items
BAG URO CATCHER STRL LF (MISCELLANEOUS) ×2 IMPLANT
BASKET LASER NITINOL 1.9FR (BASKET) IMPLANT
BASKET ZERO TIP NITINOL 2.4FR (BASKET) ×1 IMPLANT
BSKT STON RTRVL 120 1.9FR (BASKET)
BSKT STON RTRVL ZERO TP 2.4FR (BASKET) ×1
CATH INTERMIT  6FR 70CM (CATHETERS) ×2 IMPLANT
CLOTH BEACON ORANGE TIMEOUT ST (SAFETY) ×2 IMPLANT
EXTRACTOR STONE 1.7FRX115CM (UROLOGICAL SUPPLIES) IMPLANT
FIBER LASER FLEXIVA 365 (UROLOGICAL SUPPLIES) IMPLANT
FIBER LASER TRAC TIP (UROLOGICAL SUPPLIES) ×1 IMPLANT
GLOVE BIO SURGEON STRL SZ7.5 (GLOVE) ×2 IMPLANT
GOWN STRL REUS W/TWL XL LVL3 (GOWN DISPOSABLE) ×2 IMPLANT
GUIDEWIRE ANG ZIPWIRE 038X150 (WIRE) IMPLANT
GUIDEWIRE STR DUAL SENSOR (WIRE) ×3 IMPLANT
KIT TURNOVER KIT A (KITS) IMPLANT
MANIFOLD NEPTUNE II (INSTRUMENTS) ×2 IMPLANT
PACK CYSTO (CUSTOM PROCEDURE TRAY) ×2 IMPLANT
PENCIL SMOKE EVACUATOR (MISCELLANEOUS) IMPLANT
SHEATH URETERAL 12FRX28CM (UROLOGICAL SUPPLIES) ×1 IMPLANT
SHEATH URETERAL 12FRX35CM (MISCELLANEOUS) IMPLANT
STENT CONTOUR 6FRX24X.038 (STENTS) ×2 IMPLANT
TUBING CONNECTING 10 (TUBING) ×2 IMPLANT
TUBING UROLOGY SET (TUBING) ×2 IMPLANT

## 2018-12-10 NOTE — ED Notes (Signed)
Updated pt regarding ultrasound test to be completed. Spoke with [place] tech who states that it will hopefully be within the next hour. Notified pt.

## 2018-12-10 NOTE — ED Triage Notes (Signed)
Pt c/o r flank pain radiating around to r lower abd.  Vomited x 1.  Pt has history of kidney stones.

## 2018-12-10 NOTE — Anesthesia Procedure Notes (Signed)
Procedure Name: Intubation Date/Time: 12/10/2018 6:10 PM Performed by: Claudia Desanctis, CRNA Pre-anesthesia Checklist: Patient identified, Emergency Drugs available, Suction available and Patient being monitored Patient Re-evaluated:Patient Re-evaluated prior to induction Oxygen Delivery Method: Circle system utilized Preoxygenation: Pre-oxygenation with 100% oxygen Induction Type: IV induction, Rapid sequence and Cricoid Pressure applied Laryngoscope Size: 2 and Miller Grade View: Grade I Tube type: Oral Tube size: 7.0 mm Number of attempts: 1 Airway Equipment and Method: Stylet Placement Confirmation: ETT inserted through vocal cords under direct vision,  positive ETCO2 and breath sounds checked- equal and bilateral Secured at: 21 cm Tube secured with: Tape Dental Injury: Teeth and Oropharynx as per pre-operative assessment

## 2018-12-10 NOTE — ED Notes (Signed)
U/S in room at this time. 

## 2018-12-10 NOTE — Anesthesia Preprocedure Evaluation (Signed)
Anesthesia Evaluation  Patient identified by MRN, date of birth, ID band Patient awake    Reviewed: Allergy & Precautions, NPO status , Patient's Chart, lab work & pertinent test results  Airway Mallampati: II  TM Distance: >3 FB Neck ROM: Full    Dental  (+) Teeth Intact, Dental Advisory Given   Pulmonary    breath sounds clear to auscultation       Cardiovascular  Rhythm:Regular Rate:Normal     Neuro/Psych    GI/Hepatic   Endo/Other    Renal/GU      Musculoskeletal   Abdominal   Peds  Hematology   Anesthesia Other Findings   Reproductive/Obstetrics                             Anesthesia Physical Anesthesia Plan  ASA: II and emergent  Anesthesia Plan: General   Post-op Pain Management:    Induction: Intravenous, Rapid sequence and Cricoid pressure planned  PONV Risk Score and Plan: Ondansetron and Dexamethasone  Airway Management Planned: Oral ETT  Additional Equipment:   Intra-op Plan:   Post-operative Plan: Extubation in OR  Informed Consent: I have reviewed the patients History and Physical, chart, labs and discussed the procedure including the risks, benefits and alternatives for the proposed anesthesia with the patient or authorized representative who has indicated his/her understanding and acceptance.     Dental advisory given  Plan Discussed with: CRNA and Anesthesiologist  Anesthesia Plan Comments:         Anesthesia Quick Evaluation

## 2018-12-10 NOTE — Discharge Instructions (Addendum)
You have been diagnosed today with bilateral kidney stones, bilateral hydronephrosis, elevated liver function tests.  At this time there does not appear to be the presence of an emergent medical condition, however there is always the potential for conditions to change. Please read and follow the below instructions.  Please return to the Emergency Department immediately for any new or worsening symptoms. Please be sure to follow up with your Primary Care Provider within one week regarding your visit today; please call their office to schedule an appointment even if you are feeling better for a follow-up visit. Have your family member drive you directly to Dr. Purvis Sheffield office in [facility] for further evaluation.  Do not take any detour's.  Do not eat or drink for the rest of the day as Dr. Gloriann Loan plans to bring you to the operating room today. Your liver function test today were elevated as well.  Please call your primary care doctor's office today to schedule a follow-up appointment for further evaluation of your liver function tests.  You will likely need a recheck of blood work soon in addition to other tests.  Get help right away if: You have a fever or chills. You get very bad pain. You get new pain in your belly (abdomen). You pass out (faint). You cannot pee. You have nausea, vomiting or diarrhea You have any new/concerning or worsening symptoms  Please read the additional information packets attached to your discharge summary.  Do not take your medicine if  develop an itchy rash, swelling in your mouth or lips, or difficulty breathing; call 911 and seek immediate emergency medical attention if this occurs.  Note: Portions of this text may have been transcribed using voice recognition software. Every effort was made to ensure accuracy; however, inadvertent computerized transcription errors may still be present.

## 2018-12-10 NOTE — H&P (Signed)
CC/HPI: CC: Bilateral ureteral calculi  HPI:  She presented to the emergency department today with severe flank pain. CT scan was performed that showed bilateral ureteral calculi. Creatinine is normal. No leukocytosis. Urinalysis without infectious parameters. She continues to have severe flank pain. No nausea, vomiting, fever. She has had stones in the past. She has had ureteroscopy before.     ALLERGIES: Aspirin Minocycline Steroids Surgical TAPE Tetracycline    MEDICATIONS: Lisinopril  Biotin  Multivitamin  Preservision Areds  Vitamin B12     GU PSH: None   NON-GU PSH: Hernia Repair     GU PMH: None   NON-GU PMH: Cardiac murmur, unspecified Hypercholesterolemia Hypertension Sleep Apnea    FAMILY HISTORY: 2 daughters - Other Diabetes - Father, Sister Myocardial Infarction - Father, Brother nephrolithiasis - Mother   SOCIAL HISTORY: Marital Status: Single Preferred Language: English; Race: White Current Smoking Status: Patient has never smoked.   Tobacco Use Assessment Completed: Used Tobacco in last 30 days? Does not drink caffeine.    REVIEW OF SYSTEMS:    GU Review Female:   Patient reports hard to postpone urination, get up at night to urinate, leakage of urine, and stream starts and stops. Patient denies burning /pain with urination, trouble starting your stream, have to strain to urinate, and being pregnant.  Gastrointestinal (Upper):   Patient reports nausea, vomiting, and indigestion/ heartburn.   Gastrointestinal (Lower):   Patient reports constipation. Patient denies diarrhea.  Constitutional:   Patient reports night sweats and fatigue. Patient denies fever and weight loss.  Skin:   Patient reports skin rash/ lesion and itching.   Eyes:   Patient reports blurred vision. Patient denies double vision.  Ears/ Nose/ Throat:   Patient reports sore throat and sinus problems.   Hematologic/Lymphatic:   Patient reports easy bruising. Patient denies swollen  glands.  Cardiovascular:   Patient reports chest pains. Patient denies leg swelling.  Respiratory:   Patient reports shortness of breath. Patient denies cough.  Endocrine:   Patient denies excessive thirst.  Musculoskeletal:   Patient reports back pain. Patient denies joint pain.  Neurological:   Patient reports dizziness and headaches.   Psychologic:   Patient denies depression and anxiety.   Notes: HEMATURIA    VITAL SIGNS:      12/10/2018 04:12 PM  BP 127/81 mmHg  Heart Rate 112 /min  Temperature 97.7 F / 36.5 C   MULTI-SYSTEM PHYSICAL EXAMINATION:    Constitutional: Well-nourished. No physical deformities. Normally developed. Good grooming.  Respiratory: No labored breathing, no use of accessory muscles.   Cardiovascular: Normal temperature, adequate perfusion of extremities  Skin: No paleness, no jaundice  Neurologic / Psychiatric: Oriented to time, oriented to place, oriented to person. No depression, no anxiety, no agitation.  Gastrointestinal: No mass, no tenderness, no rigidity, non obese abdomen.  Eyes: Normal conjunctivae. Normal eyelids.  Musculoskeletal: Normal gait and station of head and neck.     PAST DATA REVIEWED:  Source Of History:  Patient  Lab Test Review:   BMP, CBC with Diff  Records Review:   Previous Doctor Records, Previous Hospital Records, Previous Patient Records  Urine Test Review:   Urinalysis   PROCEDURES:          Urinalysis w/Scope Dipstick Dipstick Cont'd Micro  Color: Yellow Bilirubin: Neg mg/dL WBC/hpf: 20 - 40/hpf  Appearance: Slightly Cloudy Ketones: Neg mg/dL RBC/hpf: 20 - 40/hpf  Specific Gravity: 1.015 Blood: 3+ ery/uL Bacteria: Rare (0-9/hpf)  pH: 7.5 Protein: 2+ mg/dL Cystals:  NS (Not Seen)  Glucose: Neg mg/dL Urobilinogen: 2.0 mg/dL Casts: NS (Not Seen)    Nitrites: Neg Trichomonas: Not Present    Leukocyte Esterase: 2+ leu/uL Mucous: Not Present      Epithelial Cells: NS (Not Seen)      Yeast: NS (Not Seen)      Sperm: Not  Present    ASSESSMENT:      ICD-10 Details  1 GU:   Ureteral calculus - A999333   2   Renal colic - [id number]   3   Ureteral obstruction secondary to calculous - N13.2    PLAN:           Document Letter(s):  Created for Patient: Clinical Summary         Notes:   Proceed with ureteroscopy with laser lithotripsy and ureteral stent placement. Risks and benefits discussed.        Next Appointment:      Next Appointment: 12/18/2018 04:30 PM    Appointment Type: Surgery     Location: [facility] [phone]    Provider: Link Snuffer, III, M.D.    Reason for Visit: WL/OP CYSTO BL URS LL STENT     Signed by Link Snuffer, III, M.D. on 12/10/18 at 5:10 PM (EST

## 2018-12-10 NOTE — ED Notes (Signed)
IV left in due to pt going to Dr. Purvis Sheffield office then to [facility] for surgery.  Pt is a difficult stick and okay with EDP with pt going with IV in place.  IV flushed without difficultly and wrapped to protect IV.  Pt verbalized understanding of instructions-NPO and IV.

## 2018-12-10 NOTE — Op Note (Signed)
Operative Note  Preoperative diagnosis:  1.  Bilateral renal and ureteral calculi  Postoperative diagnosis: 1.  Same  Procedure(s): 1.  Cystoscopy, bilateral retrograde pyelogram, bilateral ureteroscopy with stone extraction, laser lithotripsy, ureteral stent placement  Surgeon: Link Snuffer, MD  Assistants: Sharlot Gowda  Anesthesia: General  Complications: None immediate  EBL: Minimal  Specimens: 1.  None  Drains/Catheters: 1.  Bilateral 6 x 24 double-J ureteral stent  Intraoperative findings: 1.  Normal urethra 2.  Normal bladder mucosa without tumor 3.  Right retrograde pyelogram revealed no evidence of filling defect within the kidney.  There was mild hydronephrosis.  Ureteroscopy on the right revealed a 5 mm calculus that was completely fragmented followed by basket extraction. 4.  Left retrograde pyelogram revealed no evidence of any filling defect within the kidney except for where the stone was.  There was mild hydronephrosis.  Ureteroscopy revealed a 6 mm left mid ureteral calculus fragmented and basket extracted.  There were 2 renal calculi that were fragmented to tiny fragments.  Indication: 63 year old female with bilateral ureteral calculi presents for the previously mentioned operation.  Description of procedure:  The patient was identified and consent was obtained.  The patient was taken to the operating room and placed in the supine position.  The patient was placed under general anesthesia.  Perioperative antibiotics were administered.  The patient was placed in dorsal lithotomy.  Patient was prepped and draped in a standard sterile fashion and a timeout was performed.  A 21 French rigid cystoscope was advanced into the urethra and into the bladder.  Complete cystoscopy was performed with no abnormal findings.  The right ureter was cannulated with a sensor wire which was advanced up to the kidney under fluoroscopic guidance.  A semirigid ureteroscope was advanced  alongside the wire up to the stone of interest which was laser fragmented to tiny fragments followed by basket extraction.  I advanced the scope up to the renal pelvis and no other calculi were seen.  I shot a retrograde pyelogram through the scope with findings noted above.  I withdrew the scope and backloaded the wire onto a rigid cystoscope followed by routine placement of a 6 x 24 double-J ureteral stent.  Fluoroscopy confirmed proximal placement and direct visualization confirmed a good coil within the bladder.  Advanced a sensor wire up the left ureter and into the kidney.  A semirigid ureteroscope was advanced alongside the wire up to the stone of interest which was laser fragmented to tiny fragments followed by basket extraction.  Advance the scope up to the renal pelvis and no other ureteral calculi were seen.  I shot a retrograde pyelogram through the scope with the findings noted above.  A second wire was advanced through the scope and the scope was withdrawn.  A 12 x 14 ureteral access sheath was advanced over one of the wires under continuous fluoroscopic guidance.  The inner sheath along with the wire were withdrawn.  Flexible ureteroscopy was performed and 2 stone fragments were encountered which were laser fragmented to tiny fragments.  No other clinically significant stone fragments were seen.  The scope along with the access sheath were withdrawn.  There was no significant ureteral trauma.  No other ureteral calculi.  The wire was backloaded onto a rigid cystoscope which was advanced into the bladder followed by routine placement of a 6 x 24 double-J ureteral stent.  Fluoroscopy confirmed proximal placement and direct visualization confirmed a good coil within the bladder.  I drained  the bladder and withdrew the scope.  Patient tolerated procedure well and was stable postoperatively.  Plan: Return in 1 week for stent removal.

## 2018-12-10 NOTE — Discharge Instructions (Signed)

## 2018-12-10 NOTE — Transfer of Care (Signed)
Immediate Anesthesia Transfer of Care Note  Patient: Rhonda Ruiz  Procedure(s) Performed: Procedure(s): CYSTOSCOPY WITH RETROGRADE PYELOGRAM, URETEROSCOPY AND STENT PLACEMENT (Bilateral)  Patient Location: PACU  Anesthesia Type:General  Level of Consciousness:  sedated, patient cooperative and responds to stimulation  Airway & Oxygen Therapy:Patient Spontanous Breathing and Patient connected to face mask oxgen  Post-op Assessment:  Report given to PACU RN and Post -op Vital signs reviewed and stable  Post vital signs:  Reviewed and stable  Last Vitals:  Vitals:   12/10/18 1745  BP: 125/68  Pulse: 75  Resp: 16  Temp: 37.1 C  SpO2: 99991111    Complications: No apparent anesthesia complications

## 2018-12-10 NOTE — ED Provider Notes (Signed)
[facility] EMERGENCY DEPARTMENT Provider Note   CSN: [id number] Arrival date & time: 12/10/18  0827     History   Chief Complaint Chief Complaint  Patient presents with   Flank Pain    HPI Rhonda Ruiz is a 63 y.o. female history nephrolithiasis, blindness, hyperlipidemia, hypertension, obesity, sleep apnea, hysterectomy.  Patient reports sudden onset right flank pain that began at 4 AM this morning she describes a sharp throbbing sensation constant severe in intensity waxing and waning nonradiating.  She reports that the pain caused her to have one episode of nonbloody/nonbilious emesis at home prior to arrival.  She took 1 "pain pill" at 430 without relief of symptoms.  She reports similar in the past due to kidney stone.  She reports that she has had to have stent placement by urologist in [facility] for kidney stones in the past.  Denies fever/chills, headache, neck pain, chest pain/shortness of breath, abdominal pain, diarrhea, dysuria/hematuria, vaginal bleeding/discharge, numbness/tingling, weakness or any additional concerns.     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    Patient Active Problem List   Diagnosis Date Noted   Hyperlipidemia with target LDL less than 100 02/05/2014   Legally blind 02/05/2014   Morbid obesity ([place]) 02/05/2014   Osteoarthrosis, unspecified whether generalized or localized, shoulder region 05/30/2013   HYPERTENSION, BENIGN 08/24/2008   FATIGUE 08/24/2008   SINUS TACHYCARDIA 08/21/2008    Past Surgical  History:  Procedure Laterality Date   ABDOMINAL HYSTERECTOMY     CESAREAN SECTION     x2   CHOLECYSTECTOMY     EYE SURGERY Bilateral    optic sheath   GASTRIC BYPASS     GASTRIC BYPASS     HERNIA REPAIR     abd hernia   JOINT REPLACEMENT Right 10   hip   REVERSE SHOULDER ARTHROPLASTY Left 05/30/2013   DR NORRIS   REVERSE SHOULDER ARTHROPLASTY Left 05/30/2013   Procedure: LEFT REVERSE SHOULDER ARTHROPLASTY;  Surgeon: [name], MD;  Location: [facility];  Service: Orthopedics;  Laterality: Left;     OB History   No obstetric history on file.      Home Medications    Prior to Admission medications   Medication Sig Start Date End Date Taking? Authorizing Provider  Biotin 10 MG CAPS Take 1 capsule by mouth daily.   Yes [provider]  Cyanocobalamin (VITAMIN B12 PO) Take 1 tablet by mouth every other day.    Yes [provider]  fluticasone (FLONASE) 50 MCG/ACT nasal spray Use 2 sprays in each  nostril daily Patient taking differently: Place 2 sprays into both nostrils daily as needed for allergies or rhinitis.  08/25/15  Yes Hassell Done, Mary-Margaret, FNP  HYDROcodone-acetaminophen (NORCO) 5-325 MG tablet Take 2 tablets by mouth every 4 (four) hours as needed for moderate pain. Patient taking differently: Take 1 tablet by mouth every 4 (four) hours as needed for moderate pain.  01/21/18  Yes Daleen Bo, MD  lisinopril (PRINIVIL,ZESTRIL) 5 MG tablet Take 1 tablet by mouth daily. 01/17/18  Yes [provider]  Multiple Vitamin (MULTI-VITAMIN) tablet Take 1 tablet by mouth daily.   Yes [provider]  Multiple Vitamins-Minerals (PRESERVISION AREDS 2+MULTI VIT) CAPS Take 1 capsule by mouth 2 (two) times daily.   Yes [provider]  omeprazole (PRILOSEC) 20 MG capsule Take 1 capsule by mouth daily as needed.  04/30/17  Yes [provider]  ondansetron (ZOFRAN) 4 MG tablet Take 1 tablet (4 mg total) by mouth every 6 (six)  hours as needed for nausea or vomiting. 01/05/18  Yes Julianne Rice, MD  potassium citrate (UROCIT-K) 10 MEQ (1080 MG) SR tablet Take 1 tablet by mouth once a week.  08/13/18  Yes [provider]  tamsulosin (FLOMAX) 0.4 MG CAPS capsule Take 1 capsule by mouth daily as needed.  11/15/17  Yes [provider]  Vitamin D, Ergocalciferol, (DRISDOL) 1.25 MG (50000 UT) CAPS capsule Take 50,000 Units by mouth once a week. 09/17/18  Yes [provider]    Family History Family History  Problem Relation Age of Onset   Diabetes Father    Heart attack Father    Heart attack Brother     Social History Social History   Tobacco Use   Smoking status: Never Smoker   Smokeless tobacco: Never Used  Substance Use Topics   Alcohol use: No   Drug use: No     Allergies   Aspirin, Prednisone, Other, Tape, and Tetracyclines & related   Review of Systems Review of Systems Ten systems are reviewed and are negative for acute change except as noted in the HPI   Physical Exam Updated Vital Signs BP 118/70    Pulse 95    Temp 98.3 F (36.8 C) (Oral)    Resp 20    Wt 73.9 kg    SpO2 94%    BMI 35.27 kg/m   Physical Exam Constitutional:      General: She is not in acute distress.    Appearance: Normal appearance. She is well-developed. She is not ill-appearing or diaphoretic.  HENT:     Head: Normocephalic and atraumatic.     Right Ear: External ear normal.     Left Ear: External ear normal.     Nose: Nose normal.  Eyes:     General: Vision grossly intact. Gaze aligned appropriately.     Pupils: Pupils are equal, round, and reactive to light.  Neck:     Musculoskeletal: Normal range of motion.     Trachea: Trachea and phonation normal. No tracheal deviation.  Pulmonary:     Effort: Pulmonary effort is normal. No respiratory distress.  Abdominal:     General: There is no distension.     Palpations: Abdomen is soft.     Tenderness: There is no abdominal  tenderness. There is right CVA tenderness. There is no left CVA tenderness, guarding or rebound.  Musculoskeletal: Normal range of motion.     Comments: No midline C/T/L spinal tenderness to palpation, no deformity, crepitus, or step-off noted. No sign of injury to the neck or back.   Skin:    General: Skin is warm and dry.  Neurological:     Mental Status: She is alert.     GCS: GCS eye subscore is 4. GCS verbal subscore is 5. GCS motor subscore is 6.     Comments: Speech is clear and goal oriented,  follows commands Major Cranial nerves without deficit, no facial droop Moves extremities without ataxia, coordination intact  Psychiatric:        Behavior: Behavior normal.      ED Treatments / Results  Labs (all labs ordered are listed, but only abnormal results are displayed) Labs Reviewed  URINALYSIS, ROUTINE W REFLEX MICROSCOPIC - Abnormal; Notable for the following components:      Result Value   Hgb urine dipstick SMALL (*)    RBC / HPF >50 (*)    All other components within normal limits  COMPREHENSIVE METABOLIC PANEL - Abnormal; Notable for the following components:   Glucose, Bld 153 (*)    AST 271 (*)    ALT 127 (*)    Alkaline Phosphatase 183 (*)    All other components within normal limits  RESPIRATORY PANEL BY RT PCR (FLU A&B, COVID)  CBC WITH DIFFERENTIAL/PLATELET  LIPASE, BLOOD    EKG None  Radiology US Abdomen Complete  Result Date: 12/10/2018 CLINICAL DATA:  Elevated LFTs EXAM: ABDOMEN ULTRASOUND COMPLETE COMPARISON:  CT abdomen 12/10/2018 FINDINGS: Gallbladder: Surgically absent Common bile duct: Diameter: 6.3 mm Liver: Mild heterogeneous hepatic echotexture. No focal hepatic mass. Portal vein is patent on color Doppler imaging with normal direction of blood flow towards the liver. IVC: No abnormality visualized. Pancreas: Limited visualization secondary to overlying bowel gas. Spleen: Size and appearance within normal limits. Right Kidney: Length: 10.1 cm.  Echogenicity within normal limits. No mass or hydronephrosis visualized. Multiple renal calculi with the largest measuring 9 mm. Left Kidney: Length: 9.9 cm. Echogenicity within normal limits. No mass or hydronephrosis visualized. Multiple left renal calculi with largest measuring up to 7 mm. Abdominal aorta: No aneurysm visualized. Other findings: None. IMPRESSION: 1. Bilateral nephrolithiasis.  No obstructive uropathy. 2. Mild heterogeneous echotexture without focal abnormality. Electronically Signed   By: Kathreen Devoid   On: 12/10/2018 13:11   Ct Renal Stone Study  Result Date: 12/10/2018 CLINICAL DATA:  Flank pain with recurrent stone disease suspected. EXAM: CT ABDOMEN AND PELVIS WITHOUT CONTRAST TECHNIQUE: Multidetector CT imaging of the abdomen and pelvis was performed following the standard protocol without IV contrast. COMPARISON:  01/21/2018 FINDINGS: Lower chest:  No contributory findings. Hepatobiliary: No focal liver abnormality.Cholecystectomy. No bile duct dilatation. Pancreas: Fatty infiltration of the pancreas. No distinct lesion is seen today separate from the distal common bile duct. Spleen: Unremarkable. Adrenals/Urinary Tract: Negative adrenals. Mild right hydroureteronephrosis secondary to a 6 x 4 mm stone in the distal ureter at the level of the pelvis. There are 3 right renal calculi measuring up to 7 mm in the interpolar region. At least 7 left renal calculi including a 7 mm stone at the hilum. This stone is smaller than before, likely due to lithotripsy. A left upper pole stone has also been fragmented/diminished. Left hydroureter above a 4 mm stone at the level of the iliac crest. Negative bladder. Stomach/Bowel: No obstruction. Gastric bypass. No evidence of bowel inflammation or obstruction Vascular/Lymphatic: Mild atherosclerotic calcifications. No mass or adenopathy. Reproductive:Hysterectomy. Other: No ascites or pneumoperitoneum.  Hernia repair using mesh. Musculoskeletal: Right  hip arthroplasty which causes extensive artifact across the pelvis. Transitional S1 vertebra. L5-S1 advanced facet arthropathy with anterolisthesis. Lower thoracic disc degeneration. IMPRESSION: 1. Mild right hydronephrosis due to 6 x 4 mm distal ureteral stone. 2. Mild left hydroureter above a 4 mm left ureteral stone at the level of L5. 3. Multiple renal calculi seen on both sides. There has been fragmentation of left upper  pole and left hilar calculi when compared to prior. Electronically Signed   By: Monte Fantasia M.D.   On: 12/10/2018 10:04    Procedures Procedures (including critical care time)  Medications Ordered in ED Medications  HYDROmorphone (DILAUDID) injection 0.5 mg (0.5 mg Intravenous Given 12/10/18 0904)  ondansetron (ZOFRAN) injection 4 mg (4 mg Intravenous Given 12/10/18 0904)     Initial Impression / Assessment and Plan / ED Course  I have reviewed the triage vital signs and the nursing notes.  Pertinent labs & imaging results that were available during my care of the patient were reviewed by me and considered in my medical decision making (see chart for details).  Clinical Course as of Dec 09 1398  Tue Dec 10, 2018  1146 Dr. Gloriann Loan.   [BM]  1347 Dr. Gloriann Loan.   [BM]    Clinical Course User Index [BM] Nuala Alpha A, PA-C    CBC within normal limits Lipase within normal limits CMP with glucose 153, elevation of AST 271, ALT 127, alk phos 183 Urinalysis with blood, no signs of infection CT renal stone study:  IMPRESSION:  1. Mild right hydronephrosis due to 6 x 4 mm distal ureteral stone.  2. Mild left hydroureter above a 4 mm left ureteral stone at the  level of L5.  3. Multiple renal calculi seen on both sides. There has been  fragmentation of left upper pole and left hilar calculi when  compared to prior.  - Patient reevaluated resting comfortably no acute distress states improvement of symptoms today following 0.5 mg of Dilaudid.  She has been informed  of results as above and states understanding.  She denies any Tylenol or alcohol use or any history of recent viral illnesses were diarrhea.  She is aware of elevation of LFTs, will obtain ultrasound here in the ER to evaluate for possible stone, if negative patient will need to follow-up with PCP for further work-up and recheck. - Discussed bilateral hydronephrosis with urologist Dr. Gloriann Loan, advises possibility of patient is comfortable to drive to his office immediately after discharge today for treatment, he is asked for rapid Covid test in the meantime and to call him back with update. - Abdominal ultrasound:  IMPRESSION:  1. Bilateral nephrolithiasis. No obstructive uropathy.  2. Mild heterogeneous echotexture without focal abnormality.   COVID/FluA/V: Negative - Patient reassessed resting comfortably no acute distress states understanding of plan of care and results as above and has no further questions.  Patient is legally blind her brother is in route to pick patient up to drive her directly to Dr. Purvis Sheffield office for evaluation.  Patient is aware to remain n.p.o. today as there is potential for or repair with urology.  Additionally patient is aware of elevated LFTs, she states understanding to follow-up with her PCP for recheck and further work-up, she plans to call her PCPs office today to schedule a follow-up. -  1:47 PM: I rediscussed case with urologist Dr. Gloriann Loan who confirms plan, patient to drive directly to his office for evaluation likely OR this evening, to remain n.p.o.  At this time there does not appear to be any evidence of an acute emergency medical condition and the patient appears stable for discharge and POV transport to urologist office for further evaluation. Diagnosis was discussed with patient who verbalizes understanding of care plan and is agreeable to discharge. I have discussed return precautions with patient who verbalizes understanding of return precautions. Patient  encouraged to follow-up with their PCP and  urology. All questions answered.  Patient has been discharged in good condition.  Patient's case discussed with Dr. Lacinda Axon who agrees with plan to discharge with follow-up.   Note: Portions of this report may have been transcribed using voice recognition software. Every effort was made to ensure accuracy; however, inadvertent computerized transcription errors may still be present. Final Clinical Impressions(s) / ED Diagnoses   Final diagnoses:  Bilateral ureteral calculi  Bilateral hydronephrosis  Elevated liver function tests    ED Discharge Orders    None       Gari Crown 12/10/18 1401    Nat Christen, MD 12/13/18 1829

## 2018-12-12 ENCOUNTER — Encounter: Payer: Self-pay | Admitting: Gastroenterology

## 2018-12-12 NOTE — Anesthesia Postprocedure Evaluation (Signed)
Anesthesia Post Note  Patient: CHIDIMMA HRABE  Procedure(s) Performed: CYSTOSCOPY WITH RETROGRADE PYELOGRAM, URETEROSCOPY AND STENT PLACEMENT (Bilateral )     Patient location during evaluation: PACU Anesthesia Type: General Level of consciousness: awake and alert Pain management: pain level controlled Vital Signs Assessment: post-procedure vital signs reviewed and stable Respiratory status: spontaneous breathing, nonlabored ventilation, respiratory function stable and patient connected to nasal cannula oxygen Cardiovascular status: blood pressure returned to baseline and stable Postop Assessment: no apparent nausea or vomiting Anesthetic complications: no    Last Vitals:  Vitals:   12/10/18 2000 12/10/18 2015  BP: 115/90 122/89  Pulse: 79 78  Resp: 17 16  Temp: 37.1 C 37.1 C  SpO2: 94% 96%    Last Pain:  Vitals:   12/10/18 2015  TempSrc:   PainSc: 0-No pain                 Syvilla Martin COKER

## 2019-01-15 ENCOUNTER — Encounter: Payer: Self-pay | Admitting: Gastroenterology

## 2019-01-15 ENCOUNTER — Ambulatory Visit: Payer: Medicare Other | Admitting: Gastroenterology

## 2019-01-15 ENCOUNTER — Other Ambulatory Visit (INDEPENDENT_AMBULATORY_CARE_PROVIDER_SITE_OTHER): Payer: Medicare Other

## 2019-01-15 ENCOUNTER — Other Ambulatory Visit: Payer: Self-pay

## 2019-01-15 VITALS — HR 64 | Temp 98.1°F | Ht <= 58 in | Wt 171.0 lb

## 2019-01-15 DIAGNOSIS — Z1211 Encounter for screening for malignant neoplasm of colon: Secondary | ICD-10-CM

## 2019-01-15 DIAGNOSIS — K5900 Constipation, unspecified: Secondary | ICD-10-CM

## 2019-01-15 DIAGNOSIS — R7989 Other specified abnormal findings of blood chemistry: Secondary | ICD-10-CM

## 2019-01-15 DIAGNOSIS — Z1212 Encounter for screening for malignant neoplasm of rectum: Secondary | ICD-10-CM

## 2019-01-15 LAB — HEPATIC FUNCTION PANEL
ALT: 24 U/L (ref 0–35)
AST: 23 U/L (ref 0–37)
Albumin: 3.9 g/dL (ref 3.5–5.2)
Alkaline Phosphatase: 112 U/L (ref 39–117)
Bilirubin, Direct: 0.1 mg/dL (ref 0.0–0.3)
Total Bilirubin: 0.4 mg/dL (ref 0.2–1.2)
Total Protein: 6.6 g/dL (ref 6.0–8.3)

## 2019-01-15 NOTE — Patient Instructions (Addendum)
Your provider has requested that you go to the basement level for lab work before leaving today. Press "B" on the elevator. The lab is located at the first door on the left as you exit the elevator.  We will call you with the results.   You will be due for a recall colonoscopy in 05/2019. We will send you a reminder in the mail when it gets closer to that time.  Due to recent changes in healthcare laws, you may see the results of your imaging and laboratory studies on MyChart before your provider has had a chance to review them.  We understand that in some cases there may be results that are confusing or concerning to you. Not all laboratory results come back in the same time frame and the provider may be waiting for multiple results in order to interpret others.  Please give [place] 48 hours in order for your provider to thoroughly review all the results before contacting the office for clarification of your results.   Normal BMI (Body Mass Index- based on height and weight) is between 19 and 25. Your BMI today is Body mass index is 37 kg/m. Marland Kitchen Please consider follow up  regarding your BMI with your Primary Care Provider.  Thank you for choosing me and [facility].  Pricilla Riffle. Dagoberto Ligas., MD., Marval Regal

## 2019-01-15 NOTE — Progress Notes (Signed)
History of Present Illness: This is a 64 year old female referred by Rhonda Court, Rhonda Ruiz for the evaluation of elevated LFTs.  She presented to the ED with acute onset right flank pain on December 8 and was diagnosed with bilateral kidney stones.  Elevated LFTs were noted at that time: alk phos=276, AST=315, ALT=259.  Cystoscopy, bilateral ureteroscopy with stone extraction, laser lithotripsy, ureteral stent placement was performed. Hepatitis A, B, C serologies were negative. She is status post gastric bypass and status post cholecystectomy.  She reports an 80 pound weight loss following bypass surgery and her weight is stabilized at that level.  Following treatment of her kidney stones her right flank pain resolved and she has felt very well since.  She has had follow-up with urologist and here ureteral stents have been removed.  She relates occasional mild constipation for a few years.  She takes Citrucel intermittently which is effective.  No other GI symptoms. No history of liver disease.  No history of etoh use. No prior colonoscopy.  Denies weight loss, diarrhea, change in stool caliber, melena, hematochezia, nausea, vomiting, dysphagia, reflux symptoms, chest pain.   Abd [place] 12/10/2018: 1. Bilateral nephrolithiasis.  No obstructive uropathy. 2. Mild heterogeneous echotexture without focal abnormality. 3. Prior cholecystectomy. CBD 6.3 mm  CT AP without contrast 12/10/2018 1. Mild right hydronephrosis due to 6 x 4 mm distal ureteral stone. 2. Mild left hydroureter above a 4 mm left ureteral stone at the level of L5. 3. Multiple renal calculi seen on both sides. There has been fragmentation of left upper pole and left hilar calculi when compared to prior. 4. Prior cholecystectomy. No bile duct dilation.    Allergies  Allergen Reactions  . Aspirin Other (See Comments)    Pt reports that MD (Dr. August Saucer) at [facility] told pt that she shouldn't take aspirin or products containing aspirin  due to a pseudo tumor.  . Prednisone     ALL STEROIDS  . Other     Other reaction(s): Other (See Comments) Blisters-white/clear  . Tape Other (See Comments)    Blisters-white/clear  . Tetracyclines & Related    Outpatient Medications Prior to Visit  Medication Sig Dispense Refill  . Biotin 10 MG CAPS Take 1 capsule by mouth daily.    . Cyanocobalamin (VITAMIN B12 PO) Take 1 tablet by mouth every other day.     . lisinopril (PRINIVIL,ZESTRIL) 5 MG tablet Take 1 tablet by mouth daily.    . Multiple Vitamin (MULTI-VITAMIN) tablet Take 1 tablet by mouth daily.    . Multiple Vitamins-Minerals (PRESERVISION AREDS 2+MULTI VIT) CAPS Take 1 capsule by mouth 2 (two) times daily.    . tamsulosin (FLOMAX) 0.4 MG CAPS capsule Take 1 capsule by mouth daily as needed.     . Vitamin D, Ergocalciferol, (DRISDOL) 1.25 MG (50000 UT) CAPS capsule Take 50,000 Units by mouth once a week.    . fluticasone (FLONASE) 50 MCG/ACT nasal spray Use 2 sprays in each  nostril daily (Patient taking differently: Place 2 sprays into both nostrils daily as needed for allergies or rhinitis. ) 48 g 0  . HYDROcodone-acetaminophen (NORCO) 5-325 MG tablet Take 1 tablet by mouth every 4 (four) hours as needed for moderate pain. 10 tablet 0  . omeprazole (PRILOSEC) 20 MG capsule Take 1 capsule by mouth daily as needed.     . ondansetron (ZOFRAN) 4 MG tablet Take 1 tablet (4 mg total) by mouth every 6 (six) hours as needed  for nausea or vomiting. 12 tablet 0  . potassium citrate (UROCIT-K) 10 MEQ (1080 MG) SR tablet Take 1 tablet by mouth once a week.      No facility-administered medications prior to visit.   Past Medical History:  Diagnosis Date  . Anemia    hx-before hysterectomy  . Arthritis   . Headache(784.0)   . Hyperlipidemia   . Hypertension   . Intracranial hypertension    See note under "Tumor of optic nerve".  . Pneumonia    hx  . Shortness of breath   . Sleep apnea    cpap 1-2 yrs  . 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   Past Surgical History:  Procedure Laterality Date  . ABDOMINAL HYSTERECTOMY    . CESAREAN SECTION     x2  . CHOLECYSTECTOMY    . CYSTOSCOPY WITH RETROGRADE PYELOGRAM, URETEROSCOPY AND STENT PLACEMENT Bilateral 12/10/2018   Procedure: CYSTOSCOPY WITH RETROGRADE PYELOGRAM, URETEROSCOPY AND STENT PLACEMENT;  Surgeon: Lucas Mallow, MD;  Location: [facility];  Service: Urology;  Laterality: Bilateral;  . EYE SURGERY Bilateral    optic sheath  . GASTRIC BYPASS    . GASTRIC BYPASS    . HERNIA REPAIR     abd hernia  . JOINT REPLACEMENT Right 10   hip  . REVERSE SHOULDER ARTHROPLASTY Left 05/30/2013   DR NORRIS  . REVERSE SHOULDER ARTHROPLASTY Left 05/30/2013   Procedure: LEFT REVERSE SHOULDER ARTHROPLASTY;  Surgeon: [name], MD;  Location: [place];  Service: Orthopedics;  Laterality: Left;   Social History   Socioeconomic History  . Marital status: Divorced    Spouse name: Not on file  . Number of children: 2  . Years of education: Not on file  . Highest education level: Not on file  Occupational History  . Not on file  Tobacco Use  . Smoking status: Never Smoker  . Smokeless tobacco: Never Used  Substance and Sexual Activity  . Alcohol use: No  . Drug use: No  . Sexual activity: Not on file  Other Topics Concern  . Not on file  Social History Narrative  . Not on file   Social Determinants of Health   Financial Resource Strain:   . Difficulty of Paying Living Expenses: Not on file  Food Insecurity:   . Worried About Charity fundraiser in the Last Year: Not on file  . Ran Out of Food in the Last Year: Not on file  Transportation Needs:   . Lack of Transportation (Medical): Not on file  . Lack of Transportation (Non-Medical): Not on file  Physical Activity:   . Days of Exercise per Week: Not on file  . Minutes  of Exercise per Session: Not on file  Stress:   . Feeling of Stress : Not on file  Social Connections:   . Frequency of Communication with Friends and Family: Not on file  . Frequency of Social Gatherings with Friends and Family: Not on file  . Attends Religious Services: Not on file  . Active Member of Clubs or Organizations: Not on file  . Attends Archivist Meetings: Not on file  . Marital Status: Not on file   Family History  Problem Relation Age of Onset  . Diabetes Father   . Heart attack Father   .  Heart attack Brother       Review of Systems: Pertinent positive and negative review of systems were noted in the above HPI section. All other review of systems were otherwise negative.   Physical Exam: General: Well developed, well nourished, no acute distress Head: Normocephalic and atraumatic Eyes:  sclerae anicteric, EOMI Ears: Normal auditory acuity Mouth: No deformity or lesions Neck: Supple, no masses or thyromegaly Lungs: Clear throughout to auscultation Heart: Regular rate and rhythm; no murmurs, rubs or bruits Abdomen: Soft, non tender and non distended. No masses, hepatosplenomegaly or hernias noted. Normal Bowel sounds Rectal: Not done Musculoskeletal: Symmetrical with no gross deformities  Skin: No lesions on visible extremities Pulses:  Normal pulses noted Extremities: No clubbing, cyanosis, edema or deformities noted Neurological: Alert oriented x 4, grossly nonfocal Cervical Nodes:  No significant cervical adenopathy Inguinal Nodes: No significant inguinal adenopathy Psychological:  Alert and cooperative. Normal mood and affect   Assessment and Recommendations:  1.  Elevated LFTs.  Hepatitis A, B, C negative.  Mild heterogeneous echotexture on ultrasound.  Suspected reactive LFT elevations.  Rule out intrinsic liver disease, biliary disorder.  Repeat LFTs today.  If LFTs are normal, no further evaluation needed. If LFTs are trending toward  normal will repeat again in 1 month.  If LFTs remain elevated proceed with standard hepatic serologic evaluation and if etiology is not determined proceed with MRI/MRCP.   2.  Status post gastric bypass.  3.  Status post cholecystectomy.  4.  Pseudotumor cerebri, intracranial hypertension  5.  Mild constipation.  Increase daily water intake and use Citrucel qd more frequently.  6.  CRC screening average risk.  Recommended proceeding with colonoscopy. She is interested in proceeding after receiving SARS-CoV-2 vaccine and improvement in the current pandemic surge.  Recommend proceeding in spring or summer of 2021 at her convenience.    cc: Rhonda Court, Rhonda Ruiz [address] [place] [address] [place],  [place] [id number]

## 2021-10-09 IMAGING — CT CT RENAL STONE PROTOCOL
2 of 4 series · 17 of 46 positions shown, 19 images · non-contrast
Comparison: 01/21/2018

CLINICAL DATA: Flank pain with recurrent stone disease suspected.

EXAM:
CT ABDOMEN AND PELVIS WITHOUT CONTRAST
TECHNIQUE: Multidetector CT imaging of the abdomen and pelvis was performed
following the standard protocol without IV contrast.

[Series 2: axial st · axial · 0.83mm/px · z∈[+726,+1141]mm · 14 of 95 slices shown, 16 images]
[im 6/95  soft-tissue]
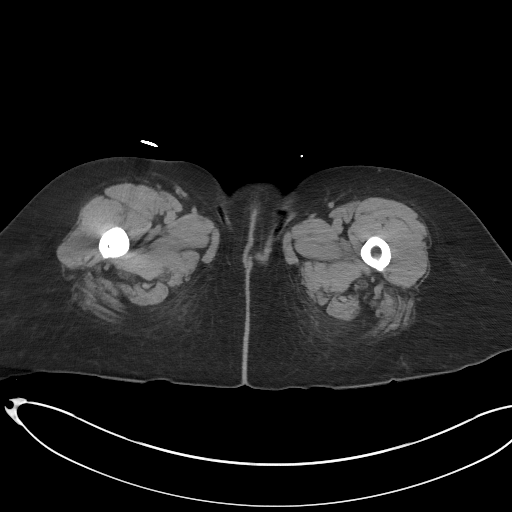
[im 6/95  bone]
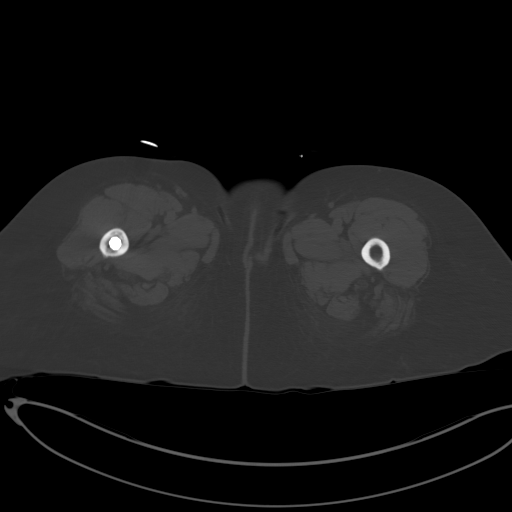
[im 11/95  soft-tissue]
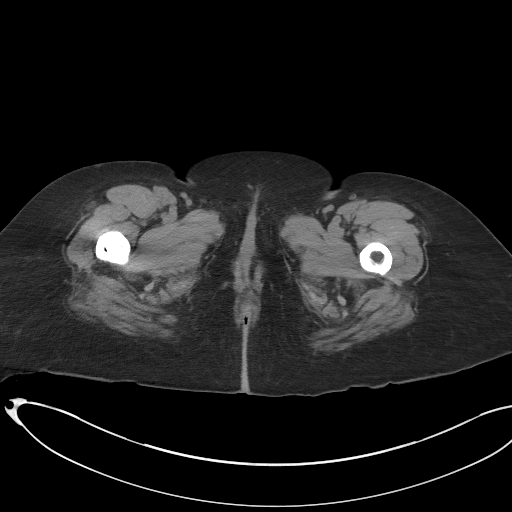
[im 21/95  soft-tissue]
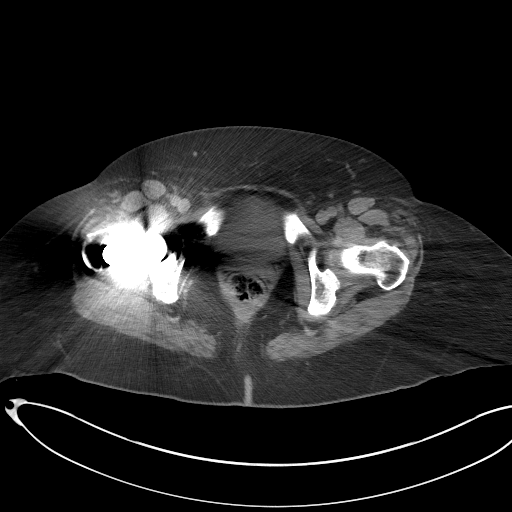
[im 27/95  soft-tissue]
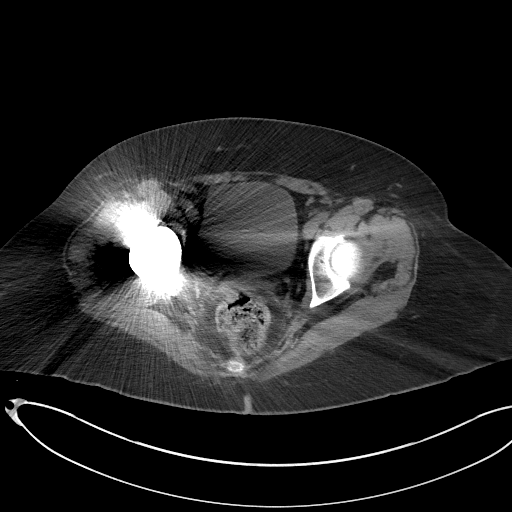
[im 32/95  soft-tissue]
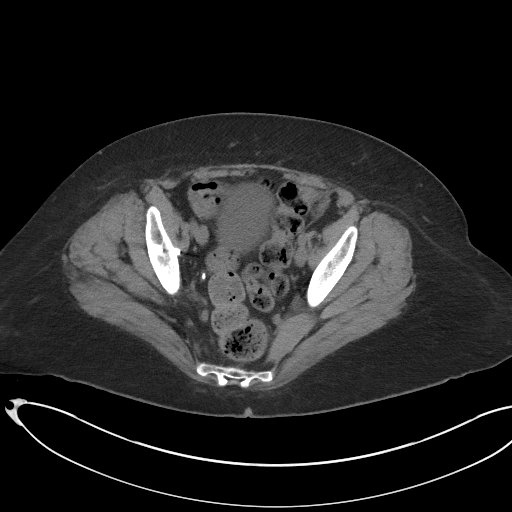
[im 37/95  soft-tissue]
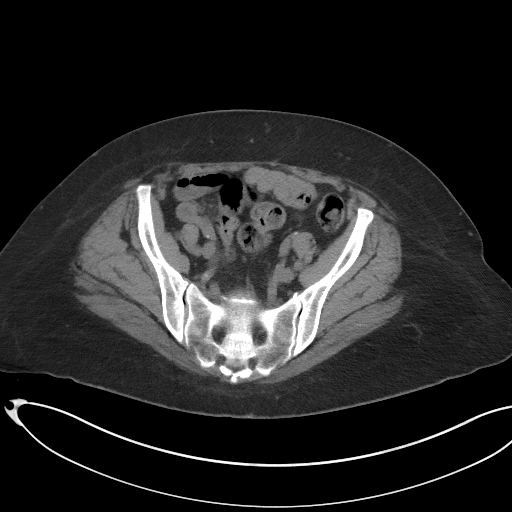
[im 42/95  soft-tissue]
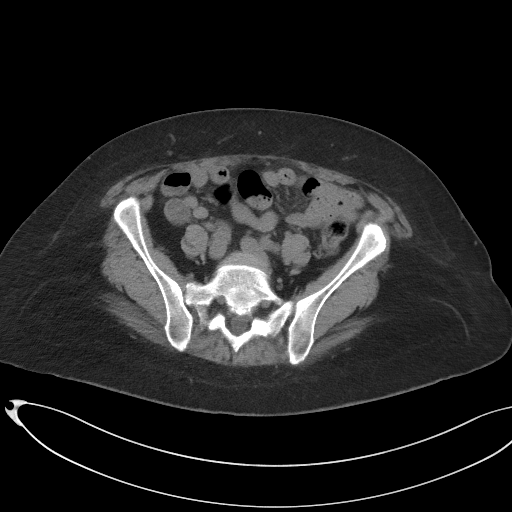
[im 53/95  soft-tissue]
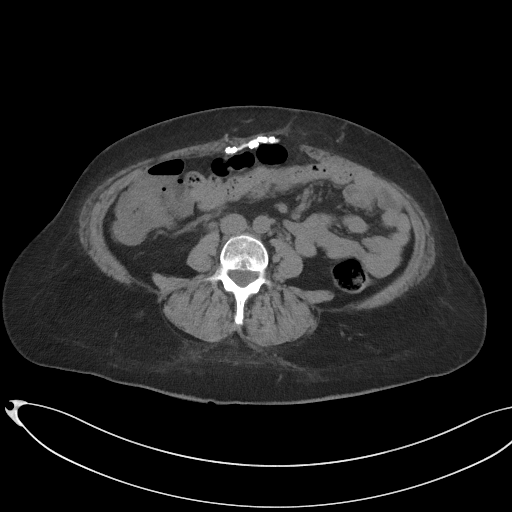
[im 58/95  soft-tissue]
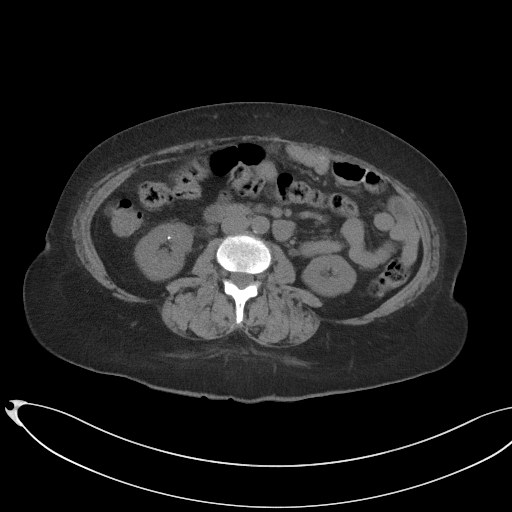
[im 58/95  bone]
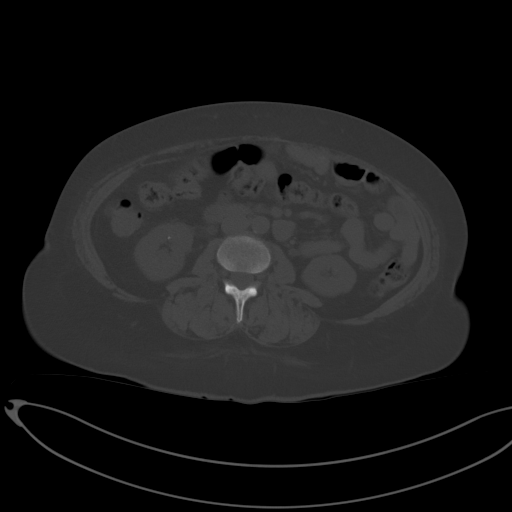
[im 63/95  soft-tissue]
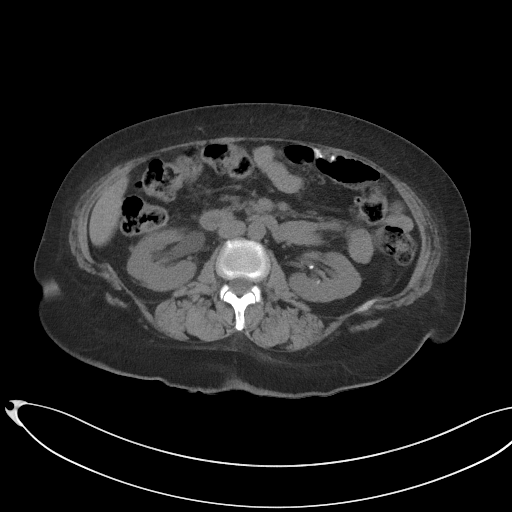
[im 68/95  soft-tissue]
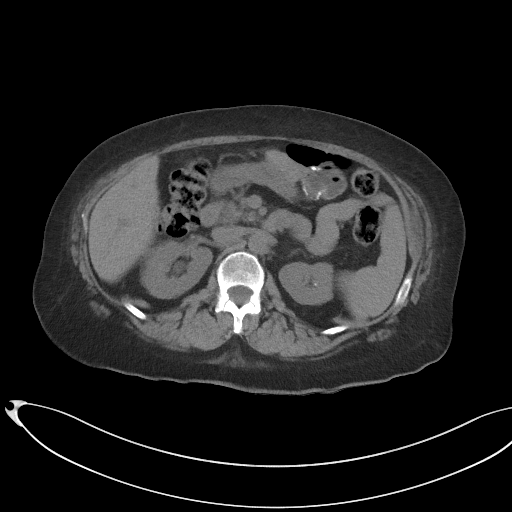
[im 74/95  soft-tissue]
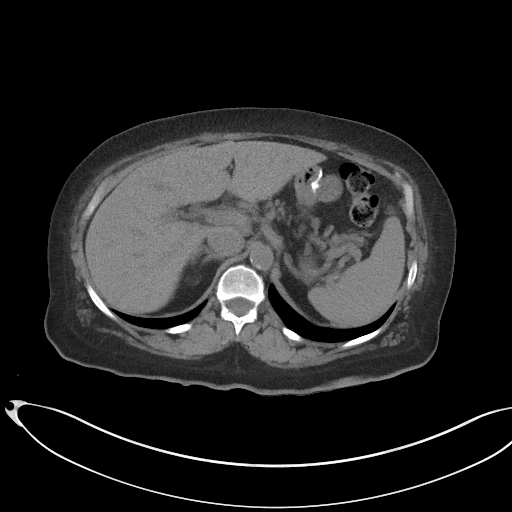
[im 84/95  soft-tissue]
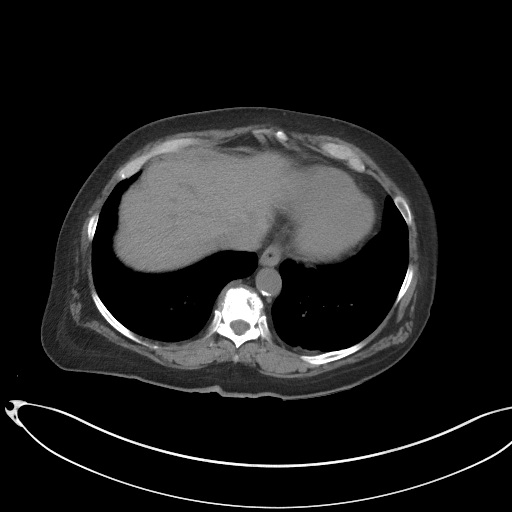
[im 89/95  soft-tissue]
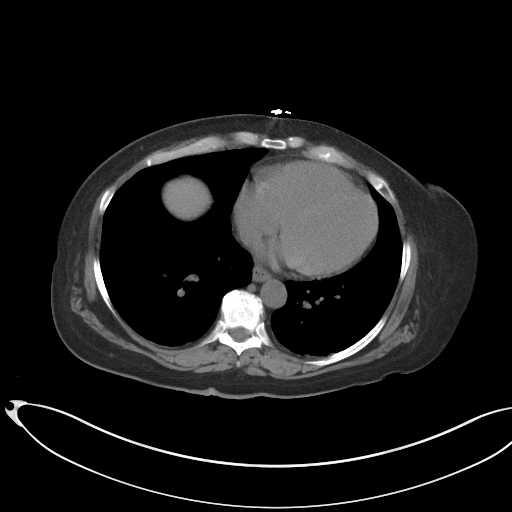

[Series 5: coronal st · coronal · 0.90mm/px · 3 of 90 slices shown]
[im 30/90  soft-tissue]
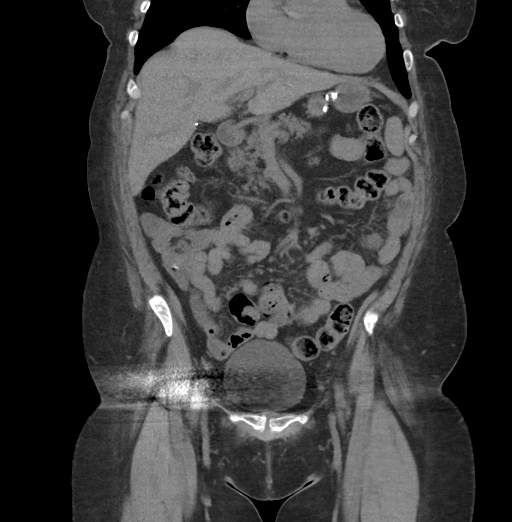
[im 40/90  soft-tissue]
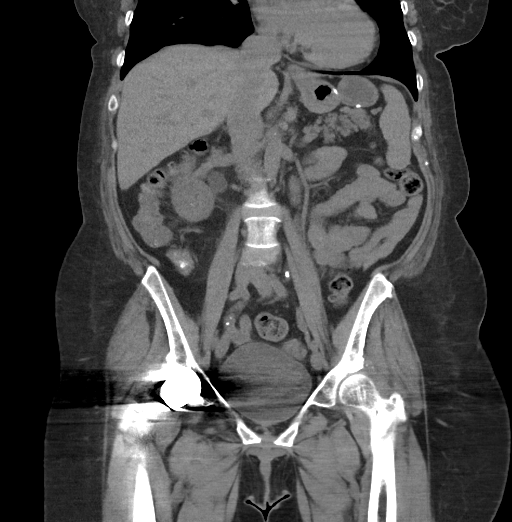
[im 50/90  soft-tissue]
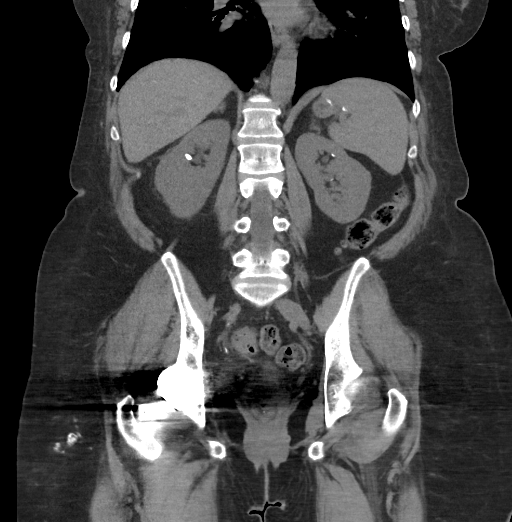

[17 of 46 positions shown; findings below may reference images not displayed]

FINDINGS: Lower chest:  No contributory findings.

Hepatobiliary: No focal liver abnormality.Cholecystectomy. No bile
duct dilatation.

Pancreas: Fatty infiltration of the pancreas. No distinct lesion is
seen today separate from the distal common bile duct.

Spleen: Unremarkable.

Adrenals/Urinary Tract: Negative adrenals. Mild right
hydroureteronephrosis secondary to a 6 x 4 mm stone in the distal
ureter at the level of the pelvis. There are 3 right renal calculi
measuring up to 7 mm in the interpolar region. At least 7 left renal
calculi including a 7 mm stone at the hilum. This stone is smaller
than before, likely due to lithotripsy. A left upper pole stone has
also been fragmented/diminished. Left hydroureter above a 4 mm stone
at the level of the iliac crest. Negative bladder.

Stomach/Bowel: No obstruction. Gastric bypass. No evidence of bowel
inflammation or obstruction

Vascular/Lymphatic: Mild atherosclerotic calcifications. No mass or
adenopathy.

Reproductive:Hysterectomy.

Other: No ascites or pneumoperitoneum.  Hernia repair using mesh.

Musculoskeletal: Right hip arthroplasty which causes extensive
artifact across the pelvis. Transitional S1 vertebra. L5-S1 advanced
facet arthropathy with anterolisthesis. Lower thoracic disc
degeneration.
IMPRESSION: 1. Mild right hydronephrosis due to 6 x 4 mm distal ureteral stone.
2. Mild left hydroureter above a 4 mm left ureteral stone at the
level of L5.
3. Multiple renal calculi seen on both sides. There has been
fragmentation of left upper pole and left hilar calculi when
compared to prior.

## 2023-10-16 NOTE — H&P (Signed)
 TOTAL HIP ADMISSION H&P  Patient is admitted for left total hip arthroplasty.  Subjective:  Chief Complaint: Left hip pain  HPI: Rhonda Ruiz, 68 y.o. female, has a history of pain and functional disability in the left hip due to arthritis and patient has failed non-surgical conservative treatments for greater than 12 weeks to include NSAID's and/or analgesics, flexibility and strengthening excercises, weight reduction as appropriate, and activity modification. Onset of symptoms was gradual, starting several years ago with gradually worsening course since that time. The patient noted no past surgery on the left hip. Patient currently rates pain in the left hip at 8 out of 10 with activity. Patient has night pain, worsening of pain with activity and weight bearing, pain that interfers with activities of daily living, and pain with passive range of motion. Patient has evidence of X-rays from the last visit demonstrate severe bone-on-bone arthritis in the left hip with subchondral cysts by imaging studies. This condition presents safety issues increasing the risk of falls. There is no current active infection.  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    Past Surgical History:   Procedure Laterality Date   ABDOMINAL HYSTERECTOMY     CESAREAN SECTION     x2   CHOLECYSTECTOMY     CYSTOSCOPY WITH RETROGRADE PYELOGRAM, URETEROSCOPY AND STENT PLACEMENT Bilateral 12/10/2018   Procedure: CYSTOSCOPY WITH RETROGRADE PYELOGRAM, URETEROSCOPY AND STENT PLACEMENT;  Surgeon: Carolee Sherwood JONETTA DOUGLAS, MD;  Location: [facility];  Service: Urology;  Laterality: Bilateral;   EYE SURGERY Bilateral    optic sheath   GASTRIC BYPASS     GASTRIC BYPASS     HERNIA REPAIR     abd hernia   JOINT REPLACEMENT Right 10   hip   REVERSE SHOULDER ARTHROPLASTY Left 05/30/2013   DR NORRIS   REVERSE SHOULDER ARTHROPLASTY Left 05/30/2013   Procedure: LEFT REVERSE SHOULDER ARTHROPLASTY;  Surgeon: Elspeth JONELLE Her, MD;  Location: [facility];  Service: Orthopedics;  Laterality: Left;    Prior to Admission medications   Medication Sig Start Date End Date Taking? Authorizing Provider  Biotin 10 MG CAPS Take 1 capsule by mouth daily.    [provider]  Cyanocobalamin (VITAMIN B12 PO) Take 1 tablet by mouth every other day.     [provider]  lisinopril  (PRINIVIL ,ZESTRIL ) 5 MG tablet Take 1 tablet by mouth daily. 01/17/18   [provider]  Multiple Vitamin (MULTI-VITAMIN) tablet Take 1 tablet by mouth daily.    [provider]  Multiple Vitamins-Minerals (PRESERVISION AREDS 2+MULTI VIT) CAPS Take 1 capsule by mouth 2 (two) times daily.  [provider]  tamsulosin (FLOMAX) 0.4 MG CAPS capsule Take 1 capsule by mouth daily as needed.  11/15/17   [provider]  Vitamin D , Ergocalciferol , (DRISDOL) 1.25 MG (50000 UT) CAPS capsule Take 50,000 Units by mouth once a week. 09/17/18   [provider]    Allergies  Allergen Reactions   Aspirin Other (See Comments)    Pt reports that MD (Dr. Dallas Serve) at [facility] told pt that she shouldn't take aspirin or products containing aspirin due to a pseudo tumor.   Prednisone     ALL STEROIDS   Other      Other reaction(s): Other (See Comments) Blisters-white/clear   Tape Other (See Comments)    Blisters-white/clear   Tetracyclines & Related     Social History   Socioeconomic History   Marital status: Divorced    Spouse name: Not on file   Number of children: 2   Years of education: Not on file   Highest education level: Not on file  Occupational History   Not on file  Tobacco Use   Smoking status: Never   Smokeless tobacco: Never  Vaping Use   Vaping status: Never Used  Substance and Sexual Activity   Alcohol use: No   Drug use: No   Sexual activity: Not on file  Other Topics Concern   Not on file  Social History Narrative   Not on file   Social Drivers of Health   Financial Resource Strain: High Risk (07/03/2022)   Received from [facility]   Overall Financial Resource Strain (CARDIA)    Difficulty of Paying Living Expenses: Very hard  Food Insecurity: No Food Insecurity (07/03/2022)   Received from [facility]   Hunger Vital Sign    Within the past 12 months, you worried that your food would run out before you got the money to buy more.: Never true    Within the past 12 months, the food you bought just didn't last and you didn't have money to get more.: Never true  Transportation Needs: No Transportation Needs (07/03/2022)   Received from [facility] - [facility]    Lack of Transportation (Medical): No    Lack of Transportation (Non-Medical): No  Physical Activity: Not on file  Stress: Not on file  Social Connections: Not on file  Intimate Partner Violence: Not on file    Tobacco Use: Low Risk  (10/11/2023)   Received from [facility]   Patient History    Smoking Tobacco Use: Never    Smokeless Tobacco Use: Never    Passive Exposure: Never   Social History   Substance and Sexual Activity  Alcohol Use No    Family History  Problem Relation Age of Onset   Diabetes Father    Heart attack Father    Heart attack Brother     Review  of Systems  Constitutional:  Negative for chills and fever.  HENT:  Negative for congestion, sore throat and tinnitus.   Eyes:  Negative for double vision, photophobia and pain.  Respiratory:  Negative for cough, shortness of breath and wheezing.   Cardiovascular:  Negative for chest pain, palpitations and orthopnea.  Gastrointestinal:  Negative for heartburn, nausea and vomiting.  Genitourinary:  Negative for dysuria, frequency and urgency.  Musculoskeletal:  Positive for joint pain.  Neurological:  Negative for dizziness, weakness and headaches.    Objective:  Physical Exam: Well-developed female alert and oriented in no apparent distress  Left hip:  Flexion 100, rotation inward 10, outward rotation 20 with pain on range of motion. Right hip: Flexion 120, rotation inward 30, outward rotation 40 without pain or limitation in range of motion.   Imaging Review Plain radiographs demonstrate severe degenerative joint disease of the left hip. The bone quality appears to be adequate for age and reported activity level.  Assessment/Plan:  End stage arthritis, left hip  The patient history, physical examination, clinical judgement of the provider and imaging studies are consistent with end stage degenerative joint disease of the left hip and total hip arthroplasty is deemed medically necessary. The treatment options including medical management, injection therapy, arthroscopy and arthroplasty were discussed at length. The risks and benefits of total hip arthroplasty were presented and reviewed. The risks due to aseptic loosening, infection, stiffness, dislocation/subluxation, thromboembolic complications and other imponderables were discussed. The patient acknowledged the explanation, agreed to proceed with the plan and consent was signed. Patient is being admitted for inpatient treatment for surgery, pain control, PT, OT, prophylactic antibiotics, VTE prophylaxis, progressive ambulation and  ADLs and discharge planning.The patient is planning to be discharged to home.   Patient's anticipated LOS is less than 2 midnights, meeting these requirements: - Younger than 24 - Lives within 1 hour of care - Has a competent adult at home to recover with post-op recover - NO history of  - Chronic pain requiring opiods  - Diabetes  - Coronary Artery Disease  - Heart failure  - Heart attack  - Stroke  - DVT/VTE  - Cardiac arrhythmia  - Respiratory Failure/COPD  - Renal failure  - Anemia  - Advanced Liver disease   Therapy Plans: HEP Planned DVT Prophylaxis: 10mg  Xarelto DME Needed: None PCP: Dorothey Filter, DNP (clearance received) TXA: IV Allergies: Aspirin (hx of gastric bypass and pseudo tumor), prednisone (pseudo tumor, increases CSF level), tetracyclines (pseudo tumor, increases CSF level), silicone (blistering), Metal Allergies: none Anesthesia Concerns: none BMI: 39.9 Last HgbA1c: not diabetic Pharmacy: [facility] Pain regimen: Hydrocodone , Tramadol, Tylenol   Other: - History of gastric bypass - no aspirin - No steroids** due to risk of increase in CSF  - Patient was instructed on what medications to stop prior to surgery. - Follow-up visit in 2 weeks with Dr. Melodi - Begin physical therapy following surgery - Pre-operative lab work as pre-surgical testing - Prescriptions will be provided in hospital at time of discharge  Roxie Mess, PA-C Orthopedic Surgery [facility] [place]

## 2023-10-24 NOTE — Patient Instructions (Signed)
 SURGICAL WAITING ROOM VISITATION  Patients having surgery or a procedure may have no more than 2 support people in the waiting area - these visitors may rotate.    Children under the age of 63 must have an adult with them who is not the patient.  Visitors with respiratory illnesses are discouraged from visiting and should remain at home.  If the patient needs to stay at the hospital during part of their recovery, the visitor guidelines for inpatient rooms apply. Pre-op nurse will coordinate an appropriate time for 1 support person to accompany patient in pre-op.  This support person may not rotate.    Please refer to the [facility] website for the visitor guidelines for Inpatients (after your surgery is over and you are in a regular room).    Your procedure is scheduled on: 11/07/23   Report to [facility] Main Entrance    Report to admitting at 6:00 AM   Call this number if you have problems the morning of surgery [phone]   Do not eat food :After Midnight.   After Midnight you may have the following liquids until 5:30 AM DAY OF SURGERY  Water Non-Citrus Juices (without pulp, NO RED-Apple, White grape, White cranberry) Black Coffee (NO MILK/CREAM OR CREAMERS, sugar ok)  Clear Tea (NO MILK/CREAM OR CREAMERS, sugar ok) regular and decaf                             Plain Jell-O (NO RED)                                           Fruit ices (not with fruit pulp, NO RED)                                     Popsicles (NO RED)                                                               Sports drinks like Gatorade (NO RED)                 The day of surgery:  Drink ONE (1) Pre-Surgery Clear Ensure at 5:30 AM the morning of surgery. Drink in one sitting. Do not sip.  This drink was given to you during your hospital  pre-op appointment visit. Nothing else to drink after completing the  Pre-Surgery Clear Ensure.          If you have questions, please contact your  surgeon's office.   FOLLOW BOWEL PREP AND ANY ADDITIONAL PRE OP INSTRUCTIONS YOU RECEIVED FROM YOUR SURGEON'S OFFICE!!!     Oral Hygiene is also important to reduce your risk of infection.                                    Remember - BRUSH YOUR TEETH THE MORNING OF SURGERY WITH YOUR REGULAR TOOTHPASTE  DENTURES WILL BE REMOVED PRIOR TO SURGERY PLEASE DO NOT APPLY Poly grip OR  ADHESIVES!!!   Stop all vitamins and herbal supplements 7 days before surgery.   Take these medicines the morning of surgery with A SIP OF WATER: Meclizine, Omeprazole, Topiramate                              You may not have any metal on your body including hair pins, jewelry, and body piercing             Do not wear make-up, lotions, powders, perfumes/, or deodorant  Do not wear nail polish including gel and S&S, artificial/acrylic nails, or any other type of covering on natural nails including finger and toenails. If you have artificial nails, gel coating, etc. that needs to be removed by a nail salon please have this removed prior to surgery or surgery may need to be canceled/ delayed if the surgeon/ anesthesia feels like they are unable to be safely monitored.   Do not shave  48 hours prior to surgery.    Do not bring valuables to the hospital. [facility] IS NOT             RESPONSIBLE   FOR VALUABLES.   Contacts, glasses, dentures or bridgework may not be worn into surgery.   Bring small overnight bag day of surgery.   DO NOT BRING YOUR HOME MEDICATIONS TO THE HOSPITAL. PHARMACY WILL DISPENSE MEDICATIONS LISTED ON YOUR MEDICATION LIST TO YOU DURING YOUR ADMISSION IN THE HOSPITAL!   Special Instructions: Bring a copy of your healthcare power of attorney and living will documents the day of surgery if you haven't scanned them before.              Please read over the following fact sheets you were given: IF YOU HAVE QUESTIONS ABOUT YOUR PRE-OP INSTRUCTIONS PLEASE CALL [phone].   If you  received a COVID test during your pre-op visit  it is requested that you wear a mask when out in public, stay away from anyone that may not be feeling well and notify your surgeon if you develop symptoms. If you test positive for Covid or have been in contact with anyone that has tested positive in the last 10 days please notify you surgeon.    Pre-operative 4 CHG Bath Instructions  DYNA-Hex 4 Chlorhexidine  Gluconate 4% Solution Antiseptic 4 fl. oz   You can play a key role in reducing the risk of infection after surgery. Your skin needs to be as free of germs as possible. You can reduce the number of germs on your skin by washing with CHG (chlorhexidine  gluconate) soap before surgery. CHG is an antiseptic soap that kills germs and continues to kill germs even after washing.   DO NOT use if you have an allergy to chlorhexidine /CHG or antibacterial soaps. If your skin becomes reddened or irritated, stop using the CHG and notify one of our RNs at   Please shower with the CHG soap starting 4 days before surgery using the following schedule:     Please keep in mind the following:  DO NOT shave, including legs and underarms, starting the day of your first shower.   You may shave your face at any point before/day of surgery.  Place clean sheets on your bed the day you start using CHG soap. Use a clean washcloth (not used since being washed) for each shower. DO NOT sleep with pets once you start using the CHG.  CHG Shower Instructions:  If you choose to wash your hair and private area, wash first with your normal shampoo/soap.  After you use shampoo/soap, rinse your hair and body thoroughly to remove shampoo/soap residue.  Turn the water OFF and apply about 3 tablespoons (45 ml) of CHG soap to a CLEAN washcloth.  Apply CHG soap ONLY FROM YOUR NECK DOWN TO YOUR TOES (washing for 3-5 minutes)  DO NOT use CHG soap on face, private areas, open wounds, or sores.  Pay special attention to the area  where your surgery is being performed.  If you are having back surgery, having someone wash your back for you may be helpful. Wait 2 minutes after CHG soap is applied, then you may rinse off the CHG soap.  Pat dry with a clean towel  Put on clean clothes/pajamas   If you choose to wear lotion, please use ONLY the CHG-compatible lotions on the back of this paper.     Additional instructions for the day of surgery: DO NOT APPLY any lotions, deodorants, cologne, or perfumes.   Put on clean/comfortable clothes.  Brush your teeth.  Ask your nurse before applying any prescription medications to the skin.   CHG Compatible Lotions   Aveeno Moisturizing lotion  Cetaphil Moisturizing Cream  Cetaphil Moisturizing Lotion  Clairol Herbal Essence Moisturizing Lotion, Dry Skin  Clairol Herbal Essence Moisturizing Lotion, Extra Dry Skin  Clairol Herbal Essence Moisturizing Lotion, Normal Skin  Curel Age Defying Therapeutic Moisturizing Lotion with Alpha Hydroxy  Curel Extreme Care Body Lotion  Curel Soothing Hands Moisturizing Hand Lotion  Curel Therapeutic Moisturizing Cream, Fragrance-Free  Curel Therapeutic Moisturizing Lotion, Fragrance-Free  Curel Therapeutic Moisturizing Lotion, Original Formula  Eucerin Daily Replenishing Lotion  Eucerin Dry Skin Therapy Plus Alpha Hydroxy Crme  Eucerin Dry Skin Therapy Plus Alpha Hydroxy Lotion  Eucerin Original Crme  Eucerin Original Lotion  Eucerin Plus Crme Eucerin Plus Lotion  Eucerin TriLipid Replenishing Lotion  Keri Anti-Bacterial Hand Lotion  Keri Deep Conditioning Original Lotion Dry Skin Formula Softly Scented  Keri Deep Conditioning Original Lotion, Fragrance Free Sensitive Skin Formula  Keri Lotion Fast Absorbing Fragrance Free Sensitive Skin Formula  Keri Lotion Fast Absorbing Softly Scented Dry Skin Formula  Keri Original Lotion  Keri Skin Renewal Lotion Keri Silky Smooth Lotion  Keri Silky Smooth Sensitive Skin Lotion  Nivea Body  Creamy Conditioning Oil  Nivea Body Extra Enriched Lotion  Nivea Body Original Lotion  Nivea Body Sheer Moisturizing Lotion Nivea Crme  Nivea Skin Firming Lotion  NutraDerm 30 Skin Lotion  NutraDerm Skin Lotion  NutraDerm Therapeutic Skin Cream  NutraDerm Therapeutic Skin Lotion  ProShield Protective Hand Cream  Provon moisturizing lotion  View Pre-Surgery Education Videos:  [email or web address]     Incentive Spirometer  An incentive spirometer is a tool that can help keep your lungs clear and active. This tool measures how well you are filling your lungs with each breath. Taking long deep breaths may help reverse or decrease the chance of developing breathing (pulmonary) problems (especially infection) following: A long period of time when you are unable to move or be active. BEFORE THE PROCEDURE  If the spirometer includes an indicator to show your best effort, your nurse or respiratory therapist will set it to a desired goal. If possible, sit up straight or lean slightly forward. Try not to slouch. Hold the incentive spirometer in an upright position. INSTRUCTIONS FOR USE  Sit on the edge of your bed if possible, or sit up as far as you can in  bed or on a chair. Hold the incentive spirometer in an upright position. Breathe out normally. Place the mouthpiece in your mouth and seal your lips tightly around it. Breathe in slowly and as deeply as possible, raising the piston or the ball toward the top of the column. Hold your breath for 3-5 seconds or for as long as possible. Allow the piston or ball to fall to the bottom of the column. Remove the mouthpiece from your mouth and breathe out normally. Rest for a few seconds and repeat Steps 1 through 7 at least 10 times every 1-2 hours when you are awake. Take your time and take a few normal breaths between deep breaths. The spirometer may include an indicator to show your  best effort. Use the indicator as a goal to work toward during each repetition. After each set of 10 deep breaths, practice coughing to be sure your lungs are clear. If you have an incision (the cut made at the time of surgery), support your incision when coughing by placing a pillow or rolled up towels firmly against it. Once you are able to get out of bed, walk around indoors and cough well. You may stop using the incentive spirometer when instructed by your caregiver.  RISKS AND COMPLICATIONS Take your time so you do not get dizzy or light-headed. If you are in pain, you may need to take or ask for pain medication before doing incentive spirometry. It is harder to take a deep breath if you are having pain. AFTER USE Rest and breathe slowly and easily. It can be helpful to keep track of a log of your progress. Your caregiver can provide you with a simple table to help with this. If you are using the spirometer at home, follow these instructions: SEEK MEDICAL CARE IF:  You are having difficultly using the spirometer. You have trouble using the spirometer as often as instructed. Your pain medication is not giving enough relief while using the spirometer. You develop fever of 100.5 F (38.1 C) or higher. SEEK IMMEDIATE MEDICAL CARE IF:  You cough up bloody sputum that had not been present before. You develop fever of 102 F (38.9 C) or greater. You develop worsening pain at or near the incision site. MAKE SURE YOU:  Understand these instructions. Will watch your condition. Will get help right away if you are not doing well or get worse. Document Released: 05/01/2006 Document Revised: 03/13/2011 Document Reviewed: 07/02/2006 Exit[employer] Patient Information 2014 [employer], [place].   ________________________________________________________________________  ________________________________________________________________________ WHAT IS A BLOOD TRANSFUSION? Blood Transfusion Information  A  transfusion is the replacement of blood or some of its parts. Blood is made up of multiple cells which provide different functions. Red blood cells carry oxygen and are used for blood loss replacement. White blood cells fight against infection. Platelets control bleeding. Plasma helps clot blood. Other blood products are available for specialized needs, such as hemophilia or other clotting disorders. BEFORE THE TRANSFUSION  Who gives blood for transfusions?  Healthy volunteers who are fully evaluated to make sure their blood is safe. This is blood bank blood. Transfusion therapy is the safest it has ever been in the practice of medicine. Before blood is taken from a donor, a complete history is taken to make sure that person has no history of diseases nor engages in risky social behavior (examples are intravenous drug use or sexual activity with multiple partners). The donor's travel history is screened to minimize risk of transmitting infections, such as  malaria. The donated blood is tested for signs of infectious diseases, such as HIV and hepatitis. The blood is then tested to be sure it is compatible with you in order to minimize the chance of a transfusion reaction. If you or a relative donates blood, this is often done in anticipation of surgery and is not appropriate for emergency situations. It takes many days to process the donated blood. RISKS AND COMPLICATIONS Although transfusion therapy is very safe and saves many lives, the main dangers of transfusion include:  Getting an infectious disease. Developing a transfusion reaction. This is an allergic reaction to something in the blood you were given. Every precaution is taken to prevent this. The decision to have a blood transfusion has been considered carefully by your caregiver before blood is given. Blood is not given unless the benefits outweigh the risks. AFTER THE TRANSFUSION Right after receiving a blood transfusion, you will usually  feel much better and more energetic. This is especially true if your red blood cells have gotten low (anemic). The transfusion raises the level of the red blood cells which carry oxygen, and this usually causes an energy increase. The nurse administering the transfusion will monitor you carefully for complications. HOME CARE INSTRUCTIONS  No special instructions are needed after a transfusion. You may find your energy is better. Speak with your caregiver about any limitations on activity for underlying diseases you may have. SEEK MEDICAL CARE IF:  Your condition is not improving after your transfusion. You develop redness or irritation at the intravenous (IV) site. SEEK IMMEDIATE MEDICAL CARE IF:  Any of the following symptoms occur over the next 12 hours: Shaking chills. You have a temperature by mouth above 102 F (38.9 C), not controlled by medicine. Chest, back, or muscle pain. People around you feel you are not acting correctly or are confused. Shortness of breath or difficulty breathing. Dizziness and fainting. You get a rash or develop hives. You have a decrease in urine output. Your urine turns a dark color or changes to pink, red, or brown. Any of the following symptoms occur over the next 10 days: You have a temperature by mouth above 102 F (38.9 C), not controlled by medicine. Shortness of breath. Weakness after normal activity. The white part of the eye turns yellow (jaundice). You have a decrease in the amount of urine or are urinating less often. Your urine turns a dark color or changes to pink, red, or brown. Document Released: 12/17/1999 Document Revised: 03/13/2011 Document Reviewed: 08/05/2007 [facility] Patient Information 2014 [place], [place].  _______________________________________________________________________

## 2023-10-24 NOTE — Progress Notes (Signed)
 COVID Vaccine Completed:  Date of COVID positive in last 90 days:  PCP - Dr. Dorothey Cassette Cardiologist - saw one in 2018 for abd EKG no f/u needed per pt  Chest x-ray - N/A EKG - 10/25/23 Epic/chart Stress Test - years ago per pt ECHO - 08/27/08 Epic Cardiac Cath - n/a Pacemaker/ICD device last checked:N/A Spinal Cord Stimulator:N/A  Bowel Prep - N/A  Sleep Study - yes CPAP - no per pt, had gastric bypass  Fasting Blood Sugar - preDM, no meds or checks at home Checks Blood Sugar _____ times a day  Last dose of GLP1 agonist-  N/A GLP1 instructions:  Do not take after     Last dose of SGLT-2 inhibitors-  N/A SGLT-2 instructions:  Do not take after     Blood Thinner Instructions: N/A Last dose:   Time: Aspirin Instructions:N/A Last Dose:  Activity level: Can go up a flight of stairs and perform activities of daily living without stopping and without symptoms of chest pain. SOB with activity, not new per pt. Has walker and cane if needed.  Anesthesia review: N/A  Patient denies shortness of breath, fever, cough and chest pain at PAT appointment  Patient verbalized understanding of instructions that were given to them at the PAT appointment. Patient was also instructed that they will need to review over the PAT instructions again at home before surgery.

## 2023-10-25 ENCOUNTER — Other Ambulatory Visit: Payer: Self-pay

## 2023-10-25 ENCOUNTER — Encounter (HOSPITAL_COMMUNITY): Payer: Self-pay

## 2023-10-25 ENCOUNTER — Encounter (HOSPITAL_COMMUNITY)
Admission: RE | Admit: 2023-10-25 | Discharge: 2023-10-25 | Disposition: A | Discharge status: Home / Self Care | Source: Ambulatory Visit | Attending: Orthopedic Surgery | Admitting: Orthopedic Surgery

## 2023-10-25 VITALS — BP 160/72 | HR 70 | Temp 98.2°F | Resp 16 | Ht <= 58 in | Wt 178.0 lb

## 2023-10-25 DIAGNOSIS — I10 Essential (primary) hypertension: Secondary | ICD-10-CM | POA: Diagnosis not present

## 2023-10-25 DIAGNOSIS — Z01818 Encounter for other preprocedural examination: Secondary | ICD-10-CM | POA: Insufficient documentation

## 2023-10-25 HISTORY — DX: Age-related osteoporosis without current pathological fracture: M81.0

## 2023-10-25 HISTORY — DX: Personal history of urinary calculi: Z87.442

## 2023-10-25 HISTORY — DX: Other complications of anesthesia, initial encounter: T88.59XA

## 2023-10-25 HISTORY — DX: Legal blindness, as defined in USA: H54.8

## 2023-10-25 LAB — BASIC METABOLIC PANEL WITH GFR
Anion gap: 9 (ref 5–15)
BUN: 15 mg/dL (ref 8–23)
CO2: 22 mmol/L (ref 22–32)
Calcium: 8.9 mg/dL (ref 8.9–10.3)
Chloride: 110 mmol/L (ref 98–111)
Creatinine, Ser: 0.71 mg/dL (ref 0.44–1.00)
GFR, Estimated: >60 mL/min (ref >60)
Glucose, Bld: 99 mg/dL (ref 70–99)
Potassium: 4.6 mmol/L (ref 3.5–5.1)
Sodium: 141 mmol/L (ref 135–145)

## 2023-10-25 LAB — TYPE AND SCREEN
ABO/RH(D): A POS
Antibody Screen: NEGATIVE

## 2023-10-25 LAB — CBC
HCT: 42.9 % (ref 36.0–46.0)
Hemoglobin: 13.4 g/dL (ref 12.0–15.0)
MCH: 29.8 pg (ref 26.0–34.0)
MCHC: 31.2 g/dL (ref 30.0–36.0)
MCV: 95.5 fL (ref 80.0–100.0)
Platelets: 226 K/uL (ref 150–400)
RBC: 4.49 MIL/uL (ref 3.87–5.11)
RDW: 13 % (ref 11.5–15.5)
WBC: 6.3 K/uL (ref 4.0–10.5)
nRBC: 0 % (ref 0.0–0.2)

## 2023-10-25 LAB — SURGICAL PCR SCREEN
MRSA, PCR: NEGATIVE
Staphylococcus aureus: NEGATIVE

## 2023-11-02 NOTE — Anesthesia Preprocedure Evaluation (Addendum)
 Anesthesia Evaluation  Patient identified by MRN, date of birth, ID band Patient awake    Reviewed: Allergy & Precautions, NPO status , Patient's Chart, lab work & pertinent test results  History of Anesthesia Complications (+) AWARENESS UNDER ANESTHESIA and history of anesthetic complications  Airway Mallampati: II  TM Distance: <3 FB Neck ROM: Full    Dental  (+) Teeth Intact, Dental Advisory Given   Pulmonary sleep apnea and Continuous Positive Airway Pressure Ventilation    breath sounds clear to auscultation       Cardiovascular hypertension, Pt. 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  negative psych ROS   GI/Hepatic Neg liver ROS,GERD  Medicated and Controlled,,S/p gastric bypass   Endo/Other  Obesity BMI 39  Renal/GU negative Renal ROS  negative genitourinary   Musculoskeletal  (+) Arthritis , Osteoarthritis,    Abdominal   Peds  Hematology  (+) Blood dyscrasia, anemia Hb 13.4, plpt 226   Anesthesia Other Findings   Reproductive/Obstetrics negative OB ROS                              Anesthesia Physical Anesthesia Plan  ASA: 3  Anesthesia Plan: General   Post-op Pain Management: Tylenol  PO (pre-op)*   Induction: Intravenous  PONV Risk Score and Plan: 2 and Ondansetron , Dexamethasone  and Treatment may vary due to age or medical condition  Airway Management Planned: Oral ETT  Additional Equipment: None  Intra-op Plan:   Post-operative Plan: Extubation in OR  Informed Consent:      Dental advisory given  Plan Discussed with: CRNA and Surgeon  Anesthesia Plan Comments:          Anesthesia Quick Evaluation

## 2023-11-07 ENCOUNTER — Ambulatory Visit (HOSPITAL_COMMUNITY): Payer: Self-pay | Admitting: Anesthesiology

## 2023-11-07 ENCOUNTER — Encounter (HOSPITAL_COMMUNITY)
Admission: RE | Disposition: A | Discharge status: Home / Self Care | Payer: Self-pay | Source: Ambulatory Visit | Attending: Orthopedic Surgery

## 2023-11-07 ENCOUNTER — Ambulatory Visit (HOSPITAL_COMMUNITY)

## 2023-11-07 ENCOUNTER — Encounter (HOSPITAL_COMMUNITY): Payer: Self-pay | Admitting: Orthopedic Surgery

## 2023-11-07 ENCOUNTER — Other Ambulatory Visit: Payer: Self-pay

## 2023-11-07 ENCOUNTER — Ambulatory Visit (HOSPITAL_COMMUNITY): Payer: Self-pay | Admitting: Physician Assistant

## 2023-11-07 ENCOUNTER — Ambulatory Visit (HOSPITAL_COMMUNITY)
Admission: RE | Admit: 2023-11-07 | Discharge: 2023-11-08 | Disposition: A | Discharge status: Home / Self Care | Source: Ambulatory Visit | Attending: Orthopedic Surgery | Admitting: Orthopedic Surgery

## 2023-11-07 DIAGNOSIS — Z9884 Bariatric surgery status: Secondary | ICD-10-CM | POA: Diagnosis not present

## 2023-11-07 DIAGNOSIS — M1611 Unilateral primary osteoarthritis, right hip: Secondary | ICD-10-CM | POA: Insufficient documentation

## 2023-11-07 DIAGNOSIS — M1612 Unilateral primary osteoarthritis, left hip: Secondary | ICD-10-CM

## 2023-11-07 DIAGNOSIS — Z79899 Other long term (current) drug therapy: Secondary | ICD-10-CM | POA: Insufficient documentation

## 2023-11-07 DIAGNOSIS — K219 Gastro-esophageal reflux disease without esophagitis: Secondary | ICD-10-CM | POA: Insufficient documentation

## 2023-11-07 DIAGNOSIS — G4730 Sleep apnea, unspecified: Secondary | ICD-10-CM | POA: Diagnosis not present

## 2023-11-07 DIAGNOSIS — E785 Hyperlipidemia, unspecified: Secondary | ICD-10-CM

## 2023-11-07 DIAGNOSIS — I10 Essential (primary) hypertension: Secondary | ICD-10-CM | POA: Diagnosis not present

## 2023-11-07 DIAGNOSIS — D649 Anemia, unspecified: Secondary | ICD-10-CM | POA: Diagnosis not present

## 2023-11-07 DIAGNOSIS — M169 Osteoarthritis of hip, unspecified: Secondary | ICD-10-CM | POA: Diagnosis present

## 2023-11-07 HISTORY — PX: TOTAL HIP ARTHROPLASTY: SHX124

## 2023-11-07 SURGERY — ARTHROPLASTY, HIP, TOTAL, ANTERIOR APPROACH
Anesthesia: General | Site: Hip | Laterality: Left

## 2023-11-07 MED ORDER — TRAMADOL HCL 50 MG PO TABS
50.0000 mg | ORAL_TABLET | Freq: Four times a day (QID) | ORAL | Status: DC | PRN
  Administered 2023-11-07 (×2): 50 mg via ORAL
  Filled 2023-11-07 (×2): qty 1

## 2023-11-07 MED ORDER — ONDANSETRON HCL 4 MG PO TABS: 4.0000 mg | ORAL_TABLET | Freq: Four times a day (QID) | ORAL | Status: DC | PRN

## 2023-11-07 MED ORDER — METOCLOPRAMIDE HCL 5 MG PO TABS: 5.0000 mg | ORAL_TABLET | Freq: Three times a day (TID) | ORAL | Status: DC | PRN

## 2023-11-07 MED ORDER — MECLIZINE HCL 25 MG PO TABS
25.0000 mg | ORAL_TABLET | Freq: Three times a day (TID) | ORAL | Status: DC | PRN
Start: 2023-11-07 — End: 2023-11-08

## 2023-11-07 MED ORDER — PROPOFOL 10 MG/ML IV BOLUS
INTRAVENOUS | Status: DC | PRN
  Administered 2023-11-07 (×2): 20 mg via INTRAVENOUS
  Administered 2023-11-07: 80 mg via INTRAVENOUS

## 2023-11-07 MED ORDER — LIDOCAINE HCL (PF) 2 % IJ SOLN
INTRAMUSCULAR | Status: AC
  Filled 2023-11-07: qty 5

## 2023-11-07 MED ORDER — BISACODYL 10 MG RE SUPP: 10.0000 mg | Freq: Every day | RECTAL | Status: DC | PRN

## 2023-11-07 MED ORDER — ONDANSETRON HCL 4 MG/2ML IJ SOLN: 4.0000 mg | Freq: Once | INTRAMUSCULAR | Status: DC | PRN

## 2023-11-07 MED ORDER — ACETAMINOPHEN 500 MG PO TABS: 1000.0000 mg | ORAL_TABLET | Freq: Once | ORAL | Status: AC

## 2023-11-07 MED ORDER — SUGAMMADEX SODIUM 200 MG/2ML IV SOLN
INTRAVENOUS | Status: AC
  Filled 2023-11-07: qty 2

## 2023-11-07 MED ORDER — ONDANSETRON HCL 4 MG/2ML IJ SOLN
4.0000 mg | Freq: Four times a day (QID) | INTRAMUSCULAR | Status: DC | PRN
  Administered 2023-11-07: 4 mg via INTRAVENOUS
  Filled 2023-11-07: qty 2

## 2023-11-07 MED ORDER — LACTATED RINGERS IV SOLN: INTRAVENOUS | Status: DC

## 2023-11-07 MED ORDER — PROPOFOL 1000 MG/100ML IV EMUL
INTRAVENOUS | Status: AC
  Filled 2023-11-07: qty 200

## 2023-11-07 MED ORDER — METHOCARBAMOL 1000 MG/10ML IJ SOLN
500.0000 mg | Freq: Four times a day (QID) | INTRAMUSCULAR | Status: DC | PRN
Start: 2023-11-07 — End: 2023-11-08

## 2023-11-07 MED ORDER — BUPIVACAINE-EPINEPHRINE (PF) 0.25% -1:200000 IJ SOLN
INTRAMUSCULAR | Status: DC | PRN
  Administered 2023-11-07: 30 mL

## 2023-11-07 MED ORDER — HYDROCODONE-ACETAMINOPHEN 5-325 MG PO TABS
1.0000 | ORAL_TABLET | ORAL | Status: DC | PRN
  Administered 2023-11-07 – 2023-11-08 (×2): 1 via ORAL
  Filled 2023-11-07 (×3): qty 1

## 2023-11-07 MED ORDER — TRANEXAMIC ACID-NACL 1000-0.7 MG/100ML-% IV SOLN
1000.0000 mg | INTRAVENOUS | Status: AC
  Administered 2023-11-07: 1000 mg via INTRAVENOUS
  Filled 2023-11-07: qty 100

## 2023-11-07 MED ORDER — MORPHINE SULFATE (PF) 2 MG/ML IV SOLN: 1.0000 mg | INTRAVENOUS | Status: DC | PRN

## 2023-11-07 MED ORDER — PHENYLEPHRINE HCL (PRESSORS) 10 MG/ML IV SOLN
INTRAVENOUS | Status: DC | PRN
  Administered 2023-11-07: 40 ug via INTRAVENOUS

## 2023-11-07 MED ORDER — PROPOFOL 10 MG/ML IV BOLUS
INTRAVENOUS | Status: AC
  Filled 2023-11-07: qty 20

## 2023-11-07 MED ORDER — OXYCODONE HCL 5 MG/5ML PO SOLN: 5.0000 mg | Freq: Once | ORAL | Status: AC | PRN

## 2023-11-07 MED ORDER — HYDROMORPHONE HCL 1 MG/ML IJ SOLN
INTRAMUSCULAR | Status: AC
  Filled 2023-11-07: qty 2

## 2023-11-07 MED ORDER — PANTOPRAZOLE SODIUM 40 MG PO TBEC: 40.0000 mg | DELAYED_RELEASE_TABLET | Freq: Every day | ORAL | Status: DC | PRN

## 2023-11-07 MED ORDER — HYDROMORPHONE HCL 1 MG/ML IJ SOLN
0.2500 mg | INTRAMUSCULAR | Status: DC | PRN
  Administered 2023-11-07 (×4): 0.5 mg via INTRAVENOUS

## 2023-11-07 MED ORDER — ROCURONIUM BROMIDE 10 MG/ML (PF) SYRINGE
PREFILLED_SYRINGE | INTRAVENOUS | Status: AC
  Filled 2023-11-07: qty 10

## 2023-11-07 MED ORDER — SUGAMMADEX SODIUM 200 MG/2ML IV SOLN
INTRAVENOUS | Status: DC | PRN
  Administered 2023-11-07: 200 mg via INTRAVENOUS

## 2023-11-07 MED ORDER — PHENOL 1.4 % MT LIQD: 1.0000 | OROMUCOSAL | Status: DC | PRN

## 2023-11-07 MED ORDER — WATER FOR IRRIGATION, STERILE IR SOLN
Status: DC | PRN
  Administered 2023-11-07: 2000 mL

## 2023-11-07 MED ORDER — BUPIVACAINE-EPINEPHRINE (PF) 0.25% -1:200000 IJ SOLN
INTRAMUSCULAR | Status: AC
  Filled 2023-11-07: qty 30

## 2023-11-07 MED ORDER — ACETAMINOPHEN 500 MG PO TABS
500.0000 mg | ORAL_TABLET | Freq: Four times a day (QID) | ORAL | Status: DC
  Administered 2023-11-07 – 2023-11-08 (×2): 500 mg via ORAL
  Filled 2023-11-07 (×2): qty 1

## 2023-11-07 MED ORDER — DEXAMETHASONE SOD PHOSPHATE PF 10 MG/ML IJ SOLN
INTRAMUSCULAR | Status: DC | PRN
  Administered 2023-11-07: 10 mg via INTRAVENOUS

## 2023-11-07 MED ORDER — 0.9 % SODIUM CHLORIDE (POUR BTL) OPTIME
TOPICAL | Status: DC | PRN
  Administered 2023-11-07: 1000 mL

## 2023-11-07 MED ORDER — TOPIRAMATE 25 MG PO TABS
50.0000 mg | ORAL_TABLET | Freq: Every morning | ORAL | Status: DC
  Administered 2023-11-08: 50 mg via ORAL
  Filled 2023-11-07: qty 2

## 2023-11-07 MED ORDER — FERROUS SULFATE 325 (65 FE) MG PO TABS
325.0000 mg | ORAL_TABLET | Freq: Every morning | ORAL | Status: DC
  Administered 2023-11-08: 325 mg via ORAL
  Filled 2023-11-07: qty 1

## 2023-11-07 MED ORDER — ORAL CARE MOUTH RINSE: 15.0000 mL | Freq: Once | OROMUCOSAL | Status: AC

## 2023-11-07 MED ORDER — RIVAROXABAN 10 MG PO TABS
10.0000 mg | ORAL_TABLET | Freq: Every day | ORAL | Status: DC
  Administered 2023-11-08: 10 mg via ORAL
  Filled 2023-11-07: qty 1

## 2023-11-07 MED ORDER — CHLORHEXIDINE GLUCONATE 0.12 % MT SOLN
15.0000 mL | Freq: Once | OROMUCOSAL | Status: AC
  Administered 2023-11-07: 15 mL via OROMUCOSAL

## 2023-11-07 MED ORDER — OXYCODONE HCL 5 MG PO TABS
ORAL_TABLET | ORAL | Status: AC
  Filled 2023-11-07: qty 1

## 2023-11-07 MED ORDER — AMISULPRIDE (ANTIEMETIC) 5 MG/2ML IV SOLN
10.0000 mg | Freq: Once | INTRAVENOUS | Status: DC | PRN
Start: 2023-11-07 — End: 2023-11-07

## 2023-11-07 MED ORDER — METHOCARBAMOL 500 MG PO TABS
500.0000 mg | ORAL_TABLET | Freq: Four times a day (QID) | ORAL | Status: DC | PRN
Start: 2023-11-07 — End: 2023-11-08
  Administered 2023-11-07 – 2023-11-08 (×3): 500 mg via ORAL
  Filled 2023-11-07 (×3): qty 1

## 2023-11-07 MED ORDER — ACETAMINOPHEN 10 MG/ML IV SOLN
1000.0000 mg | Freq: Four times a day (QID) | INTRAVENOUS | Status: DC
  Administered 2023-11-07: 1000 mg via INTRAVENOUS
  Filled 2023-11-07: qty 100

## 2023-11-07 MED ORDER — PROPOFOL 500 MG/50ML IV EMUL
INTRAVENOUS | Status: DC | PRN
  Administered 2023-11-07: 125 ug/kg/min via INTRAVENOUS

## 2023-11-07 MED ORDER — LIDOCAINE 2% (20 MG/ML) 5 ML SYRINGE
INTRAMUSCULAR | Status: DC | PRN
  Administered 2023-11-07: 100 mg via INTRAVENOUS

## 2023-11-07 MED ORDER — ONDANSETRON HCL 4 MG/2ML IJ SOLN
INTRAMUSCULAR | Status: DC | PRN
  Administered 2023-11-07: 4 mg via INTRAVENOUS

## 2023-11-07 MED ORDER — MIDAZOLAM HCL 2 MG/2ML IJ SOLN
INTRAMUSCULAR | Status: AC
  Filled 2023-11-07: qty 2

## 2023-11-07 MED ORDER — POLYETHYLENE GLYCOL 3350 17 G PO PACK: 17.0000 g | PACK | Freq: Every day | ORAL | Status: DC | PRN

## 2023-11-07 MED ORDER — SODIUM CHLORIDE 0.9 % IV SOLN: INTRAVENOUS | Status: DC

## 2023-11-07 MED ORDER — ONDANSETRON HCL 4 MG/2ML IJ SOLN
INTRAMUSCULAR | Status: AC
  Filled 2023-11-07: qty 2

## 2023-11-07 MED ORDER — METOCLOPRAMIDE HCL 5 MG/ML IJ SOLN
5.0000 mg | Freq: Three times a day (TID) | INTRAMUSCULAR | Status: DC | PRN
  Administered 2023-11-07: 10 mg via INTRAVENOUS
  Filled 2023-11-07: qty 2

## 2023-11-07 MED ORDER — PHENYLEPHRINE HCL-NACL 20-0.9 MG/250ML-% IV SOLN
INTRAVENOUS | Status: DC | PRN
  Administered 2023-11-07: 50 ug/min via INTRAVENOUS

## 2023-11-07 MED ORDER — CEFAZOLIN SODIUM-DEXTROSE 2-4 GM/100ML-% IV SOLN
2.0000 g | Freq: Four times a day (QID) | INTRAVENOUS | Status: AC
  Administered 2023-11-07 (×2): 2 g via INTRAVENOUS
  Filled 2023-11-07 (×2): qty 100

## 2023-11-07 MED ORDER — DOCUSATE SODIUM 100 MG PO CAPS
100.0000 mg | ORAL_CAPSULE | Freq: Two times a day (BID) | ORAL | Status: DC
  Administered 2023-11-07 – 2023-11-08 (×3): 100 mg via ORAL
  Filled 2023-11-07 (×3): qty 1

## 2023-11-07 MED ORDER — FLEET ENEMA RE ENEM: 1.0000 | ENEMA | Freq: Once | RECTAL | Status: DC | PRN

## 2023-11-07 MED ORDER — VITAMIN D 25 MCG (1000 UNIT) PO TABS
2000.0000 [IU] | ORAL_TABLET | Freq: Every morning | ORAL | Status: DC
  Administered 2023-11-08: 2000 [IU] via ORAL
  Filled 2023-11-07: qty 2

## 2023-11-07 MED ORDER — ROCURONIUM 10MG/ML (10ML) SYRINGE FOR MEDFUSION PUMP - OPTIME
INTRAVENOUS | Status: DC | PRN
  Administered 2023-11-07: 60 mg via INTRAVENOUS

## 2023-11-07 MED ORDER — FENTANYL CITRATE (PF) 250 MCG/5ML IJ SOLN
INTRAMUSCULAR | Status: AC
  Filled 2023-11-07: qty 5

## 2023-11-07 MED ORDER — CEFAZOLIN SODIUM-DEXTROSE 2-4 GM/100ML-% IV SOLN
2.0000 g | INTRAVENOUS | Status: AC
  Administered 2023-11-07: 2 g via INTRAVENOUS
  Filled 2023-11-07: qty 100

## 2023-11-07 MED ORDER — POVIDONE-IODINE 10 % EX SWAB: 2.0000 | Freq: Once | CUTANEOUS | Status: DC

## 2023-11-07 MED ORDER — MIDAZOLAM HCL (PF) 2 MG/2ML IJ SOLN
INTRAMUSCULAR | Status: DC | PRN
  Administered 2023-11-07: 2 mg via INTRAVENOUS

## 2023-11-07 MED ORDER — FENTANYL CITRATE (PF) 250 MCG/5ML IJ SOLN
INTRAMUSCULAR | Status: DC | PRN
  Administered 2023-11-07 (×4): 50 ug via INTRAVENOUS

## 2023-11-07 MED ORDER — OXYCODONE HCL 5 MG PO TABS
5.0000 mg | ORAL_TABLET | Freq: Once | ORAL | Status: AC | PRN
  Administered 2023-11-07: 5 mg via ORAL

## 2023-11-07 MED ORDER — DIPHENHYDRAMINE HCL 12.5 MG/5ML PO ELIX: 12.5000 mg | ORAL_SOLUTION | ORAL | Status: DC | PRN

## 2023-11-07 MED ORDER — MENTHOL 3 MG MT LOZG: 1.0000 | LOZENGE | OROMUCOSAL | Status: DC | PRN

## 2023-11-07 SURGICAL SUPPLY — 37 items
BAG COUNTER SPONGE SURGICOUNT (BAG) IMPLANT
BAG ZIPLOCK 12X15 (MISCELLANEOUS) IMPLANT
BLADE SAG 18X100X1.27 (BLADE) ×2 IMPLANT
COVER PERINEAL POST (MISCELLANEOUS) ×2 IMPLANT
COVER SURGICAL LIGHT HANDLE (MISCELLANEOUS) ×2 IMPLANT
CUP ACETBLR 48 OD SECTOR II (Hips) IMPLANT
DERMABOND ADVANCED .7 DNX12 (GAUZE/BANDAGES/DRESSINGS) ×2 IMPLANT
DRAPE FOOT SWITCH (DRAPES) ×2 IMPLANT
DRAPE STERI IOBAN 125X83 (DRAPES) ×2 IMPLANT
DRAPE U-SHAPE 47X51 STRL (DRAPES) ×4 IMPLANT
DRSG AQUACEL AG ADV 3.5X10 (GAUZE/BANDAGES/DRESSINGS) ×2 IMPLANT
DURAPREP 26ML APPLICATOR (WOUND CARE) ×2 IMPLANT
ELECT REM PT RETURN 15FT ADLT (MISCELLANEOUS) ×2 IMPLANT
GAUZE SPONGE 4X4 12PLY STRL (GAUZE/BANDAGES/DRESSINGS) IMPLANT
GLOVE BIO SURGEON STRL SZ 6.5 (GLOVE) IMPLANT
GLOVE BIO SURGEON STRL SZ7 (GLOVE) IMPLANT
GLOVE BIO SURGEON STRL SZ8 (GLOVE) ×2 IMPLANT
GLOVE BIOGEL PI IND STRL 7.0 (GLOVE) IMPLANT
GLOVE BIOGEL PI IND STRL 8 (GLOVE) ×2 IMPLANT
GOWN STRL REUS W/ TWL LRG LVL3 (GOWN DISPOSABLE) ×4 IMPLANT
HEAD CERAMIC DELTA 28 P1.5 HIP (Head) IMPLANT
HOLDER FOLEY CATH W/STRAP (MISCELLANEOUS) ×2 IMPLANT
KIT TURNOVER KIT A (KITS) ×2 IMPLANT
LINER ACET ALTRX NEUT +4X28X48 (Hips) IMPLANT
MANIFOLD NEPTUNE II (INSTRUMENTS) ×2 IMPLANT
PACK ANTERIOR HIP CUSTOM (KITS) ×2 IMPLANT
PENCIL SMOKE EVACUATOR COATED (MISCELLANEOUS) ×2 IMPLANT
SPIKE FLUID TRANSFER (MISCELLANEOUS) ×2 IMPLANT
STEM FEM ACTIS STD SZ2 (Stem) IMPLANT
SUT ETHIBOND NAB CT1 #1 30IN (SUTURE) ×2 IMPLANT
SUT MNCRL AB 4-0 PS2 18 (SUTURE) ×2 IMPLANT
SUT VIC AB 2-0 CT1 TAPERPNT 27 (SUTURE) ×4 IMPLANT
SUTURE STRATFX 0 PDS 27 VIOLET (SUTURE) ×2 IMPLANT
TOWEL GREEN STERILE FF (TOWEL DISPOSABLE) ×2 IMPLANT
TRAY FOLEY MTR SLVR 14FR STAT (SET/KITS/TRAYS/PACK) IMPLANT
TRAY FOLEY MTR SLVR 16FR STAT (SET/KITS/TRAYS/PACK) ×2 IMPLANT
TUBE SUCTION HIGH CAP CLEAR NV (SUCTIONS) ×2 IMPLANT

## 2023-11-07 NOTE — Anesthesia Procedure Notes (Signed)
 Procedure Name: Intubation Date/Time: 11/07/2023 10:46 AM  Performed by: Nanci Riis, CRNAPre-anesthesia Checklist: Patient identified, Emergency Drugs available, Suction available and Patient being monitored Patient Re-evaluated:Patient Re-evaluated prior to induction Oxygen Delivery Method: Circle system utilized Preoxygenation: Pre-oxygenation with 100% oxygen Induction Type: IV induction Ventilation: Mask ventilation without difficulty Laryngoscope Size: Miller and 3 Grade View: Grade II Tube type: Oral Tube size: 7.0 mm Number of attempts: 1 Airway Equipment and Method: Stylet Placement Confirmation: ETT inserted through vocal cords under direct vision, positive ETCO2 and breath sounds checked- equal and bilateral Secured at: 21 cm Tube secured with: Tape Dental Injury: Teeth and Oropharynx as per pre-operative assessment

## 2023-11-07 NOTE — Interval H&P Note (Signed)
 History and Physical Interval Note:  11/07/2023 9:23 AM  Rhonda Ruiz  has presented today for surgery, with the diagnosis of Left hip osteoarthritis.  The various methods of treatment have been discussed with the patient and family. After consideration of risks, benefits and other options for treatment, the patient has consented to  Procedure(s): ARTHROPLASTY, HIP, TOTAL, ANTERIOR APPROACH (Left) as a surgical intervention.  The patient's history has been reviewed, patient examined, no change in status, stable for surgery.  I have reviewed the patient's chart and labs.  Questions were answered to the patient's satisfaction.     Rhonda Ruiz

## 2023-11-07 NOTE — Transfer of Care (Signed)
 Immediate Anesthesia Transfer of Care Note  Patient: Rhonda Ruiz  Procedure(s) Performed: ARTHROPLASTY, HIP, TOTAL, ANTERIOR APPROACH (Left: Hip)  Patient Location: PACU  Anesthesia Type:General  Level of Consciousness: drowsy and patient cooperative  Airway & Oxygen Therapy: Patient Spontanous Breathing  Post-op Assessment: Report given to RN and Post -op Vital signs reviewed and stable  Post vital signs: Reviewed and stable  Last Vitals:  Vitals Value Taken Time  BP    Temp    Pulse 78 11/07/23 12:25  Resp 21 11/07/23 12:25  SpO2 93 % 11/07/23 12:25  Vitals shown include unfiled device data.  Last Pain:  Vitals:   11/07/23 0846  TempSrc: Oral  PainSc: 8          Complications: No notable events documented.

## 2023-11-07 NOTE — Evaluation (Signed)
 Physical Therapy Evaluation Patient Details Name: Rhonda Ruiz MRN: [id number] DOB: Mar 15, 1955 Today's Date: 11/07/2023  History of Present Illness  Pt is 68 yo female s/p L anterior THA on 11/07/23.  Pt with hx including but not limited to legally blind, OA, HLD, gastric bypass, R THA, bil TSA  Clinical Impression  Pt is s/p THA resulting in the deficits listed below (see PT Problem List). At baseline, pt is independent and lives alone.  Family will be staying with her at d/c.  She has 8 steps to enter home but then on 1 level.  Today, pt with good pain control but was nauseated and lethargic - suspect from general anesthesia.  She was able to transfer with min A and took some steps at EOB. Pt expected to progress well once more alert.  Pt will benefit from acute skilled PT to increase their independence and safety with mobility to facilitate discharge.          If plan is discharge home, recommend the following: A little help with walking and/or transfers;A little help with bathing/dressing/bathroom;Assistance with cooking/housework;Help with stairs or ramp for entrance   Can travel by private vehicle        Equipment Recommendations Other (comment) (Youth RW)  Recommendations for Other Services       Functional Status Assessment Patient has had a recent decline in their functional status and demonstrates the ability to make significant improvements in function in a reasonable and predictable amount of time.     Precautions / Restrictions Precautions Precautions: Fall Precaution/Restrictions Comments: legally blind      Mobility  Bed Mobility Overal bed mobility: Needs Assistance Bed Mobility: Supine to Sit, Sit to Supine     Supine to sit: Min assist, Used rails, HOB elevated Sit to supine: Min assist, Used rails, HOB elevated        Transfers Overall transfer level: Needs assistance Equipment used: Rolling walker (2 wheels) Transfers: Sit to/from Stand Sit to  Stand: Contact guard assist           General transfer comment: cues for hand placement    Ambulation/Gait Ambulation/Gait assistance: Contact guard assist Gait Distance (Feet): 2 Feet Assistive device: Rolling walker (2 wheels) Gait Pattern/deviations: Step-to pattern, Decreased stride length, Decreased weight shift to left Gait velocity: decreased     General Gait Details: only side steps at EOB limited due to lethargy and nausea - suspect from general anesthesia  Stairs            Wheelchair Mobility     Tilt Bed    Modified Rankin (Stroke Patients Only)       Balance Overall balance assessment: Needs assistance Sitting-balance support: No upper extremity supported Sitting balance-Leahy Scale: Good     Standing balance support: Bilateral upper extremity supported, Reliant on assistive device for balance Standing balance-Leahy Scale: Poor Standing balance comment: steady wtih RW                             Pertinent Vitals/Pain Pain Assessment Pain Assessment: 0-10 Pain Score: 4  Pain Location: L hip Pain Descriptors / Indicators: Discomfort Pain Intervention(s): Limited activity within patient's tolerance, Monitored during session, Premedicated before session, Repositioned, Ice applied    Home Living Family/patient expects to be discharged to:: Private residence Living Arrangements: Alone Available Help at Discharge: Family;Available 24 hours/day (sister and granddaughter staying for first week) Type of Home: House Home Access: Stairs to  enter Entrance Stairs-Rails: Right;Left Entrance Stairs-Number of Steps: 8   Home Layout: One level Home Equipment: Cane - single point Additional Comments: has a regular height RW but is family members    Prior Function Prior Level of Function : Independent/Modified Independent             Mobility Comments: Pt could ambulate in community without AD ADLs Comments: Independent adls and iadls;  does not drive due to legally blind     Extremity/Trunk Assessment   Upper Extremity Assessment Upper Extremity Assessment: Overall WFL for tasks assessed    Lower Extremity Assessment Lower Extremity Assessment: LLE deficits/detail;RLE deficits/detail RLE Deficits / Details: ROM WFL; MMT 5/5 LLE Deficits / Details: Expected post op changes; ROM WFL; MMT: ankle 5/5, knee 3/5, hip 1/5    Cervical / Trunk Assessment Cervical / Trunk Assessment: Normal  Communication        Cognition Arousal: Lethargic Behavior During Therapy: WFL for tasks assessed/performed   PT - Cognitive impairments: No apparent impairments                       PT - Cognition Comments: had general anesthesia Following commands: Intact       Cueing       General Comments General comments (skin integrity, edema, etc.): VSS    Exercises     Assessment/Plan    PT Assessment Patient needs continued PT services  PT Problem List Decreased strength;Decreased mobility;Decreased range of motion;Decreased activity tolerance;Decreased balance;Decreased knowledge of use of DME;Pain       PT Treatment Interventions DME instruction;Therapeutic exercise;Gait training;Functional mobility training;Stair training;Therapeutic activities;Patient/family education;Modalities;Balance training    PT Goals (Current goals can be found in the Care Plan section)  Acute Rehab PT Goals Patient Stated Goal: return home PT Goal Formulation: With patient/family Time For Goal Achievement: 11/21/23 Potential to Achieve Goals: Good    Frequency 7X/week     Co-evaluation               AM-PAC PT 6 Clicks Mobility  Outcome Measure Help needed turning from your back to your side while in a flat bed without using bedrails?: A Little Help needed moving from lying on your back to sitting on the side of a flat bed without using bedrails?: A Little Help needed moving to and from a bed to a chair (including a  wheelchair)?: A Little Help needed standing up from a chair using your arms (e.g., wheelchair or bedside chair)?: A Little Help needed to walk in hospital room?: A Lot Help needed climbing 3-5 steps with a railing? : A Lot 6 Click Score: 16    End of Session Equipment Utilized During Treatment: Gait belt Activity Tolerance: Patient limited by lethargy Patient left: in bed;with call bell/phone within reach;with bed alarm set;with SCD's reapplied;with family/visitor present Nurse Communication: Mobility status PT Visit Diagnosis: Other abnormalities of gait and mobility (R26.89);Muscle weakness (generalized) (M62.81)    Time: 8457-8398 PT Time Calculation (min) (ACUTE ONLY): 19 min   Charges:   PT Evaluation $PT Eval Low Complexity: 1 Low   PT General Charges $$ ACUTE PT VISIT: 1 Visit         Rhonda, PT Acute Rehab [facility] [phone]   Rhonda Ruiz 11/07/2023, 4:11 PM

## 2023-11-07 NOTE — Discharge Instructions (Addendum)
Gaynelle Arabian, MD Total Joint Specialist [facility] [place] [address] [place], [place] [address] [phone]  ANTERIOR APPROACH TOTAL HIP REPLACEMENT POSTOPERATIVE DIRECTIONS     Hip Rehabilitation, Guidelines Following Surgery  The results of a hip operation are greatly improved after range of motion and muscle strengthening exercises. Follow all safety measures which are given to protect your hip. If any of these exercises cause increased pain or swelling in your joint, decrease the amount until you are comfortable again. Then slowly increase the exercises. Call your caregiver if you have problems or questions.   BLOOD CLOT PREVENTION Take a 10 mg Xarelto once a day for three weeks following surgery.  You may resume your vitamins/supplements once you have discontinued the Xarelto. Do not take any NSAIDs (Advil, Aleve, Ibuprofen, Meloxicam, etc.) until you have discontinued the Xarelto.   HOME CARE INSTRUCTIONS  Remove items at home which could result in a fall. This includes throw rugs or furniture in walking pathways.  ICE to the affected hip as frequently as 20-30 minutes an hour and then as needed for pain and swelling. Continue to use ice on the hip for pain and swelling from surgery. You may notice swelling that will progress down to the foot and ankle. This is normal after surgery. Elevate the leg when you are not up walking on it.   Continue to use the breathing machine which will help keep your temperature down.  It is common for your temperature to cycle up and down following surgery, especially at night when you are not up moving around and exerting yourself.  The breathing machine keeps your lungs expanded and your temperature down.  DIET You may resume your previous home diet once your are discharged from the hospital.  DRESSING / WOUND CARE / SHOWERING You have an adhesive waterproof bandage over the incision. Leave this in place until your first  follow-up appointment. Once you remove this you will not need to place another bandage.  You may begin showering 3 days following surgery, but do not submerge the incision under water.  ACTIVITY For the first 3-5 days, it is important to rest and keep the operative leg elevated. You should, as a general rule, rest for 50 minutes and walk/stretch for 10 minutes per hour. After 5 days, you may slowly increase activity as tolerated.  Perform the exercises you were provided twice a day for about 15-20 minutes each session. Begin these 2 days following surgery. Walk with your walker as instructed. Use the walker until you are comfortable transitioning to a cane. Walk with the cane in the opposite hand of the operative leg. You may discontinue the cane once you are comfortable and walking steadily. Avoid periods of inactivity such as sitting longer than an hour when not asleep. This helps prevent blood clots.  Do not drive a car for 6 weeks or until released by your surgeon.  Do not drive while taking narcotics.  TED HOSE STOCKINGS Wear the elastic stockings on both legs for three weeks following surgery during the day. You may remove them at night while sleeping.  WEIGHT BEARING Weight bearing as tolerated with assist device (walker, cane, etc) as directed, use it as long as suggested by your surgeon or therapist, typically at least 4-6 weeks.  POSTOPERATIVE CONSTIPATION PROTOCOL Constipation - defined medically as fewer than three stools per week and severe constipation as less than one stool per week.  One of the most common issues patients have  following surgery is constipation.  Even if you have a regular bowel pattern at home, your normal regimen is likely to be disrupted due to multiple reasons following surgery.  Combination of anesthesia, postoperative narcotics, change in appetite and fluid intake all can affect your bowels.  In order to avoid complications following surgery, here are some  recommendations in order to help you during your recovery period.  Colace (docusate) - Pick up an over-the-counter form of Colace or another stool softener and take twice a day as long as you are requiring postoperative pain medications.  Take with a full glass of water daily.  If you experience loose stools or diarrhea, hold the colace until you stool forms back up.  If your symptoms do not get better within 1 week or if they get worse, check with your doctor. Dulcolax (bisacodyl) - Pick up over-the-counter and take as directed by the product packaging as needed to assist with the movement of your bowels.  Take with a full glass of water.  Use this product as needed if not relieved by Colace only.  MiraLax (polyethylene glycol) - Pick up over-the-counter to have on hand.  MiraLax is a solution that will increase the amount of water in your bowels to assist with bowel movements.  Take as directed and can mix with a glass of water, juice, soda, coffee, or tea.  Take if you go more than two days without a movement.Do not use MiraLax more than once per day. Call your doctor if you are still constipated or irregular after using this medication for 7 days in a row.  If you continue to have problems with postoperative constipation, please contact the office for further assistance and recommendations.  If you experience "the worst abdominal pain ever" or develop nausea or vomiting, please contact the office immediatly for further recommendations for treatment.  ITCHING  If you experience itching with your medications, try taking only a single pain pill, or even half a pain pill at a time.  You can also use Benadryl over the counter for itching or also to help with sleep.   MEDICATIONS See your medication summary on the "After Visit Summary" that the nursing staff will review with you prior to discharge.  You may have some home medications which will be placed on hold until you complete the course of blood  thinner medication.  It is important for you to complete the blood thinner medication as prescribed by your surgeon.  Continue your approved medications as instructed at time of discharge.  PRECAUTIONS If you experience chest pain or shortness of breath - call 911 immediately for transfer to the hospital emergency department.  If you develop a fever greater that 101 F, purulent drainage from wound, increased redness or drainage from wound, foul odor from the wound/dressing, or calf pain - CONTACT YOUR SURGEON.                                                   FOLLOW-UP APPOINTMENTS Make sure you keep all of your appointments after your operation with your surgeon and caregivers. You should call the office at the above phone number and make an appointment for approximately two weeks after the date of your surgery or on the date instructed by your surgeon outlined in the "After Visit Summary".  RANGE OF MOTION  AND STRENGTHENING EXERCISES  These exercises are designed to help you keep full movement of your hip joint. Follow your caregiver's or physical therapist's instructions. Perform all exercises about fifteen times, three times per day or as directed. Exercise both hips, even if you have had only one joint replacement. These exercises can be done on a training (exercise) mat, on the floor, on a table or on a bed. Use whatever works the best and is most comfortable for you. Use music or television while you are exercising so that the exercises are a pleasant break in your day. This will make your life better with the exercises acting as a break in routine you can look forward to.  Lying on your back, slowly slide your foot toward your buttocks, raising your knee up off the floor. Then slowly slide your foot back down until your leg is straight again.  Lying on your back spread your legs as far apart as you can without causing discomfort.  Lying on your side, raise your upper leg and foot straight up  from the floor as far as is comfortable. Slowly lower the leg and repeat.  Lying on your back, tighten up the muscle in the front of your thigh (quadriceps muscles). You can do this by keeping your leg straight and trying to raise your heel off the floor. This helps strengthen the largest muscle supporting your knee.  Lying on your back, tighten up the muscles of your buttocks both with the legs straight and with the knee bent at a comfortable angle while keeping your heel on the floor.   POST-OPERATIVE OPIOID TAPER INSTRUCTIONS: It is important to wean off of your opioid medication as soon as possible. If you do not need pain medication after your surgery it is ok to stop day one. Opioids include: Codeine, Hydrocodone(Norco, Vicodin), Oxycodone(Percocet, oxycontin) and hydromorphone amongst others.  Long term and even short term use of opiods can cause: Increased pain response Dependence Constipation Depression Respiratory depression And more.  Withdrawal symptoms can include Flu like symptoms Nausea, vomiting And more Techniques to manage these symptoms Hydrate well Eat regular healthy meals Stay active Use relaxation techniques(deep breathing, meditating, yoga) Do Not substitute Alcohol to help with tapering If you have been on opioids for less than two weeks and do not have pain than it is ok to stop all together.  Plan to wean off of opioids This plan should start within one week post op of your joint replacement. Maintain the same interval or time between taking each dose and first decrease the dose.  Cut the total daily intake of opioids by one tablet each day Next start to increase the time between doses. The last dose that should be eliminated is the evening dose.   IF YOU ARE TRANSFERRED TO A SKILLED REHAB FACILITY If the patient is transferred to a skilled rehab facility following release from the hospital, a list of the current medications will be sent to the facility  for the patient to continue.  When discharged from the skilled rehab facility, please have the facility set up the patient's [place] prior to being released. Also, the skilled facility will be responsible for providing the patient with their medications at time of release from the facility to include their pain medication, the muscle relaxants, and their blood thinner medication. If the patient is still at the rehab facility at time of the two week follow up appointment, the skilled rehab facility will also  need to assist the patient in arranging follow up appointment in our office and any transportation needs.  MAKE SURE YOU:  Understand these instructions.  Get help right away if you are not doing well or get worse.    DENTAL ANTIBIOTICS:  In most cases prophylactic antibiotics for Dental procdeures after total joint surgery are not necessary.  Exceptions are as follows:  1. History of prior total joint infection  2. Severely immunocompromised (Organ Transplant, cancer chemotherapy, Rheumatoid biologic meds such as [place])  3. Poorly controlled diabetes (A1C &gt; 8.0, blood glucose over 200)  If you have one of these conditions, contact your surgeon for an antibiotic prescription, prior to your dental procedure.    Pick up stool softner and laxative for home use following surgery while on pain medications. Do not submerge incision under water. Please use good hand washing techniques while changing dressing each day. May shower starting three days after surgery. Please use a clean towel to pat the incision dry following showers. Continue to use ice for pain and swelling after surgery. Do not use any lotions or creams on the incision until instructed by your surgeon.    Information on my medicine - XARELTO (Rivaroxaban)  Why was Xarelto prescribed for you? Xarelto was prescribed for you to reduce the risk of blood clots forming after orthopedic surgery. The  medical term for these abnormal blood clots is venous thromboembolism (VTE).  What do you need to know about xarelto ? Take your Xarelto ONCE DAILY at the same time every day. You may take it either with or without food.  If you have difficulty swallowing the tablet whole, you may crush it and mix in applesauce just prior to taking your dose.  Take Xarelto exactly as prescribed by your doctor and DO NOT stop taking Xarelto without talking to the doctor who prescribed the medication.  Stopping without other VTE prevention medication to take the place of Xarelto may increase your risk of developing a clot.  After discharge, you should have regular check-up appointments with your healthcare provider that is prescribing your Xarelto.    What do you do if you miss a dose? If you miss a dose, take it as soon as you remember on the same day then continue your regularly scheduled once daily regimen the next day. Do not take two doses of Xarelto on the same day.   Important Safety Information A possible side effect of Xarelto is bleeding. You should call your healthcare provider right away if you experience any of the following: Bleeding from an injury or your nose that does not stop. Unusual colored urine (red or dark brown) or unusual colored stools (red or black). Unusual bruising for unknown reasons. A serious fall or if you hit your head (even if there is no bleeding).  Some medicines may interact with Xarelto and might increase your risk of bleeding while on Xarelto. To help avoid this, consult your healthcare provider or pharmacist prior to using any new prescription or non-prescription medications, including herbals, vitamins, non-steroidal anti-inflammatory drugs (NSAIDs) and supplements.  This website has more information on Xarelto: [email or web address]

## 2023-11-07 NOTE — Op Note (Signed)
 OPERATIVE REPORT- TOTAL HIP ARTHROPLASTY   PREOPERATIVE DIAGNOSIS: Osteoarthritis of the Left hip.   POSTOPERATIVE DIAGNOSIS: Osteoarthritis of the Left  hip.   PROCEDURE: Left total hip arthroplasty, anterior approach.   SURGEON: Dempsey Moan, MD   ASSISTANT: Roxie Mess, PA-C  ANESTHESIA:  Spinal  ESTIMATED BLOOD LOSS:-250 mL    DRAINS: None  COMPLICATIONS: None   CONDITION: PACU - hemodynamically stable.   BRIEF CLINICAL NOTE: Rhonda Ruiz is a 68 y.o. female who has advanced end-  stage arthritis of their Left  hip with progressively worsening pain and  dysfunction.The patient has failed nonoperative management and presents for  total hip arthroplasty.   PROCEDURE IN DETAIL: After successful administration of spinal  anesthetic, the traction boots for the [facility] bed were placed on both  feet and the patient was placed onto the [facility] bed, boots placed into the leg  holders. The Left hip was then isolated from the perineum with plastic  drapes and prepped and draped in the usual sterile fashion. ASIS and  greater trochanter were marked and a oblique incision was made, starting  at about 1 cm lateral and 2 cm distal to the ASIS and coursing towards  the anterior cortex of the femur. The skin was cut with a 10 blade  through subcutaneous tissue to the level of the fascia overlying the  tensor fascia lata muscle. The fascia was then incised in line with the  incision at the junction of the anterior third and posterior 2/3rd. The  muscle was teased off the fascia and then the interval between the TFL  and the rectus was developed. The Hohmann retractor was then placed at  the top of the femoral neck over the capsule. The vessels overlying the  capsule were cauterized and the fat on top of the capsule was removed.  A Hohmann retractor was then placed anterior underneath the rectus  femoris to give exposure to the entire anterior capsule. A T-shaped  capsulotomy  was performed. The edges were tagged and the femoral head  was identified.       Osteophytes are removed off the superior acetabulum.  The femoral neck was then cut in situ with an oscillating saw. Traction  was then applied to the left lower extremity utilizing the [facility]  traction. The femoral head was then removed. Retractors were placed  around the acetabulum and then circumferential removal of the labrum was  performed. Osteophytes were also removed. Reaming starts at 43 mm to  medialize and  Increased in 2 mm increments to 47 mm. We reamed in  approximately 40 degrees of abduction, 20 degrees anteversion. A 48 mm  pinnacle acetabular shell was then impacted in anatomic position under  fluoroscopic guidance with excellent purchase. We did not need to place  any additional dome screws. A 28 mm neutral + 4 Altrx liner was then  placed into the acetabular shell.       The femoral lift was then placed along the lateral aspect of the femur  just distal to the vastus ridge. The leg was  externally rotated and capsule  was stripped off the inferior aspect of the femoral neck down to the  level of the lesser trochanter, this was done with electrocautery. The femur was lifted after this was performed. The  leg was then placed in an extended and adducted position essentially delivering the femur. We also removed the capsule superiorly and the piriformis from the piriformis fossa to  gain excellent exposure of the  proximal femur. Rongeur was used to remove some cancellous bone to get  into the lateral portion of the proximal femur for placement of the  initial starter reamer. The starter broaches was placed  the starter broach  and was shown to go down the center of the canal. Broaching  with the Actis system was then performed starting at size 0  coursing  Up to size 2. A size 2 had excellent torsional and rotational  and axial stability. The trial standard offset neck was then placed  with a 28 +  1.5 trial head. The hip was then reduced. We confirmed that  the stem was in the canal both on AP and lateral x-rays. It also has excellent sizing. The hip was reduced with outstanding stability through full extension and full external rotation.. AP pelvis was taken and the leg lengths were measured and found to be equal. Hip was then dislocated again and the femoral head and neck removed. The  femoral broach was removed. Size 2 Actis stem with a standard offset  neck was then impacted into the femur following native anteversion. Has  excellent purchase in the canal. Excellent torsional and rotational and  axial stability. It is confirmed to be in the canal on AP and lateral  fluoroscopic views. The 28 + 1.5 ceramic head was placed and the hip  reduced with outstanding stability. Again AP pelvis was taken and it  confirmed that the leg lengths were equal. The wound was then copiously  irrigated with saline solution and the capsule reattached and repaired  with Ethibond suture. 30 ml of .25% Bupivicaine was  injected into the capsule and into the edge of the tensor fascia lata as well as subcutaneous tissue. The fascia overlying the tensor fascia lata was then closed with a running #1 V-Loc. Subcu was closed with interrupted 2-0 Vicryl and subcuticular running 4-0 Monocryl. Incision was cleaned  and dried. Steri-Strips and a bulky sterile dressing applied. The patient was awakened and transported to  recovery in stable condition.        Please note that a surgical assistant was a medical necessity for this procedure to perform it in a safe and expeditious manner. Assistant was necessary to provide appropriate retraction of vital neurovascular structures and to prevent femoral fracture and allow for anatomic placement of the prosthesis.  Dempsey Moan, M.D.

## 2023-11-08 ENCOUNTER — Encounter (HOSPITAL_COMMUNITY): Payer: Self-pay | Admitting: Orthopedic Surgery

## 2023-11-08 ENCOUNTER — Other Ambulatory Visit (HOSPITAL_COMMUNITY): Payer: Self-pay

## 2023-11-08 DIAGNOSIS — M1611 Unilateral primary osteoarthritis, right hip: Secondary | ICD-10-CM | POA: Diagnosis not present

## 2023-11-08 LAB — BASIC METABOLIC PANEL WITH GFR
Anion gap: 9 (ref 5–15)
BUN: 18 mg/dL (ref 8–23)
CO2: 21 mmol/L — ABNORMAL LOW (ref 22–32)
Calcium: 8.1 mg/dL — ABNORMAL LOW (ref 8.9–10.3)
Chloride: 109 mmol/L (ref 98–111)
Creatinine, Ser: 0.75 mg/dL (ref 0.44–1.00)
GFR, Estimated: >60 mL/min (ref >60)
Glucose, Bld: 118 mg/dL — ABNORMAL HIGH (ref 70–99)
Potassium: 4 mmol/L (ref 3.5–5.1)
Sodium: 139 mmol/L (ref 135–145)

## 2023-11-08 LAB — CBC
HCT: 33.2 % — ABNORMAL LOW (ref 36.0–46.0)
Hemoglobin: 10.9 g/dL — ABNORMAL LOW (ref 12.0–15.0)
MCH: 31.6 pg (ref 26.0–34.0)
MCHC: 32.8 g/dL (ref 30.0–36.0)
MCV: 96.2 fL (ref 80.0–100.0)
Platelets: 175 K/uL (ref 150–400)
RBC: 3.45 MIL/uL — ABNORMAL LOW (ref 3.87–5.11)
RDW: 13.1 % (ref 11.5–15.5)
WBC: 10.2 K/uL (ref 4.0–10.5)
nRBC: 0 % (ref 0.0–0.2)

## 2023-11-08 MED ORDER — ONDANSETRON HCL 4 MG PO TABS
4.0000 mg | ORAL_TABLET | Freq: Four times a day (QID) | ORAL | 0 refills | Status: AC | PRN
  Filled 2023-11-08: qty 20, 5d supply, fill #0

## 2023-11-08 MED ORDER — METHOCARBAMOL 500 MG PO TABS
500.0000 mg | ORAL_TABLET | Freq: Four times a day (QID) | ORAL | 0 refills | Status: AC | PRN
  Filled 2023-11-08: qty 40, 10d supply, fill #0

## 2023-11-08 MED ORDER — RIVAROXABAN 10 MG PO TABS
10.0000 mg | ORAL_TABLET | Freq: Every day | ORAL | 0 refills | Status: AC
  Filled 2023-11-08: qty 20, 20d supply, fill #0

## 2023-11-08 MED ORDER — TRAMADOL HCL 50 MG PO TABS
50.0000 mg | ORAL_TABLET | Freq: Four times a day (QID) | ORAL | 0 refills | Status: AC | PRN
  Filled 2023-11-08: qty 40, 5d supply, fill #0

## 2023-11-08 MED ORDER — HYDROCODONE-ACETAMINOPHEN 5-325 MG PO TABS
1.0000 | ORAL_TABLET | ORAL | 0 refills | Status: AC | PRN
  Filled 2023-11-08: qty 42, 7d supply, fill #0

## 2023-11-08 NOTE — Plan of Care (Signed)
  Problem: Coping: Goal: Level of anxiety will decrease 11/08/2023 0408 by Trudy Maus, RN Outcome: Progressing 11/08/2023 0408 by Trudy Maus, RN Outcome: Progressing   Problem: Elimination: Goal: Will not experience complications related to bowel motility 11/08/2023 0408 by Trudy Maus, RN Outcome: Progressing 11/08/2023 0408 by Trudy Maus, RN Outcome: Progressing Goal: Will not experience complications related to urinary retention 11/08/2023 0408 by Trudy Maus, RN Outcome: Progressing 11/08/2023 0408 by Trudy Maus, RN Outcome: Progressing   Problem: Pain Managment: Goal: General experience of comfort will improve and/or be controlled 11/08/2023 0408 by Trudy Maus, RN Outcome: Progressing 11/08/2023 0408 by Trudy Maus, RN Outcome: Progressing   Problem: Safety: Goal: Ability to remain free from injury will improve 11/08/2023 0408 by Trudy Maus, RN Outcome: Progressing 11/08/2023 0408 by Trudy Maus, RN Outcome: Progressing

## 2023-11-08 NOTE — Progress Notes (Signed)
 Discharge meds in a secure bag delivered to patient in room by this RN

## 2023-11-08 NOTE — Progress Notes (Signed)
 Discharge instructions were reviewed with the patient.Pt inquired about insurance form for [facility]. Form copied and original given to the pt.  She denied additional questions or concerns at this time.She is awaiting delivery of prescription medications from [facility] pharmacy. IV removed, site is clean dry and intact. Dressing to left hip is clean dry and intact.

## 2023-11-08 NOTE — Anesthesia Postprocedure Evaluation (Signed)
 Anesthesia Post Note  Patient: Rhonda Ruiz  Procedure(s) Performed: ARTHROPLASTY, HIP, TOTAL, ANTERIOR APPROACH (Left: Hip)     Patient location during evaluation: PACU Anesthesia Type: General Level of consciousness: awake Pain management: pain level controlled Vital Signs Assessment: post-procedure vital signs reviewed and stable Respiratory status: spontaneous breathing Cardiovascular status: blood pressure returned to baseline Postop Assessment: no apparent nausea or vomiting Anesthetic complications: no   No notable events documented.                Lauraine KATHEE [place]

## 2023-11-08 NOTE — Progress Notes (Signed)
 Physical Therapy Treatment Patient Details Name: Rhonda Ruiz MRN: [id number] DOB: September 10, 1955 Today's Date: 11/08/2023   History of Present Illness Pt is 68 yo female s/p L anterior THA on 11/07/23.  Pt with hx including but not limited to legally blind, OA, HLD, gastric bypass, R THA, bil TSA    PT Comments  Pt is POD # 1 and is progressing well.  Pt much more alert and feeling much better today.  She was able to ambulate 47' with RW and performed stairs similar to home setup and similar to step stool into car.  She had good pain control, understanding of HEP and progression, and safety awareness.  Pt demonstrates safe gait & transfers in order to return home from PT perspective once discharged by MD.  While in hospital, will continue to benefit from PT for skilled therapy to advance mobility and exercises.       If plan is discharge home, recommend the following: A little help with walking and/or transfers;A little help with bathing/dressing/bathroom;Assistance with cooking/housework;Help with stairs or ramp for entrance   Can travel by private vehicle        Equipment Recommendations  Other (comment) (youth RW (has been delivered))    Recommendations for Other Services       Precautions / Restrictions Precautions Precautions: Fall Precaution/Restrictions Comments: legally blind Restrictions Weight Bearing Restrictions Per Provider Order: Yes LLE Weight Bearing Per Provider Order: Weight bearing as tolerated     Mobility  Bed Mobility Overal bed mobility: Needs Assistance Bed Mobility: Supine to Sit     Supine to sit: Supervision     General bed mobility comments: self assist L LE with belt    Transfers Overall transfer level: Needs assistance Equipment used: Rolling walker (2 wheels) Transfers: Sit to/from Stand Sit to Stand: Supervision           General transfer comment: started CGA progressed to supervision; STS X 3    Ambulation/Gait Ambulation/Gait  assistance: Supervision Gait Distance (Feet): 80 Feet Assistive device: Rolling walker (2 wheels) Gait Pattern/deviations: Step-to pattern, Decreased stride length, Decreased weight shift to left, Antalgic Gait velocity: functional     General Gait Details: Steady gait, good use of RW   Stairs Stairs: Yes Stairs assistance: Contact guard assist     General stair comments: Started wtih 2 steps bil rails and progressed to 8 steps single rail sideways to simulate home environment.  Pt educated on cues initially and then recalling and using correct rail without cues - steady balance.  Pt also has elevated SUV to ride in and step stool to enter.  Worked on backing to 1 step and going up backward with RW for stability.  Tolerated well.  Performed single step backward x 3 for practice and so pt feels comfortable.   Wheelchair Mobility     Tilt Bed    Modified Rankin (Stroke Patients Only)       Balance Overall balance assessment: Needs assistance Sitting-balance support: No upper extremity supported Sitting balance-Leahy Scale: Good     Standing balance support: Bilateral upper extremity supported, No upper extremity supported Standing balance-Leahy Scale: Fair Standing balance comment: RW to ambulate; could static stand without support                            Communication    Cognition Arousal: Alert Behavior During Therapy: WFL for tasks assessed/performed   PT - Cognitive impairments: No apparent impairments  Cueing    Exercises Total Joint Exercises Ankle Circles/Pumps: AROM, Both, 10 reps, Supine Quad Sets: AROM, Both, 10 reps, Supine Heel Slides: AAROM, Left, 5 reps, Supine Hip ABduction/ADduction: AAROM, Left, 5 reps, Supine Long Arc Quad: AROM, Left, Seated, 10 reps Other Exercises Other Exercises: standing L leg AROM with RW and supervision 5 reps: marching, hip abd, hip ext, h-s curl    General  Comments General comments (skin integrity, edema, etc.): VSS Educated on safe ice use, no pivots, car transfers, and TED hose during day. Also, encouraged walking every 1-2 hours during day for 5-10 mins. Educated on HEP with focus on mobility the first week. Discussed doing exercises within pain control and if pain increasing could decreased ROM, reps, and stop exercises as needed. Encouraged to perform ankle pumps frequently for blood flow.      Pertinent Vitals/Pain Pain Assessment Pain Assessment: 0-10 Pain Score: 3  Pain Location: L hip Pain Descriptors / Indicators: Sore Pain Intervention(s): Limited activity within patient's tolerance, Monitored during session, Premedicated before session, Repositioned, Ice applied    Home Living                          Prior Function            PT Goals (current goals can now be found in the care plan section) Progress towards PT goals: Progressing toward goals    Frequency    7X/week      PT Plan      Co-evaluation              AM-PAC PT 6 Clicks Mobility   Outcome Measure  Help needed turning from your back to your side while in a flat bed without using bedrails?: A Little Help needed moving from lying on your back to sitting on the side of a flat bed without using bedrails?: A Little Help needed moving to and from a bed to a chair (including a wheelchair)?: A Little Help needed standing up from a chair using your arms (e.g., wheelchair or bedside chair)?: A Little Help needed to walk in hospital room?: A Little Help needed climbing 3-5 steps with a railing? : A Little 6 Click Score: 18    End of Session Equipment Utilized During Treatment: Gait belt Activity Tolerance: Patient tolerated treatment well Patient left: with call bell/phone within reach;with family/visitor present;in chair Nurse Communication: Mobility status PT Visit Diagnosis: Other abnormalities of gait and mobility (R26.89);Muscle  weakness (generalized) (M62.81)     Time: 9052-8979 PT Time Calculation (min) (ACUTE ONLY): 33 min  Charges:    $Gait Training: 8-22 mins $Therapeutic Exercise: 8-22 mins PT General Charges $$ ACUTE PT VISIT: 1 Visit                     Benjiman, PT Acute Rehab [facility] Rehab [phone]    [name] 11/08/2023, 11:47 AM

## 2023-11-08 NOTE — TOC Transition Note (Signed)
 Transition of Care [facility] - Discharge Note   Patient Details  Name: Rhonda Ruiz MRN: [id number] Date of Birth: [birth year]  Transition of Care [facility]) CM/SW Contact:  Alfonse JONELLE Rex, RN Phone Number: 11/08/2023, 10:13 AM   Clinical Narrative:   Patient admitted for scheduled L THA, HEP, youth walker delivered to bedside by [employer]. No TOC needs.     Final next level of care: Home/Self Care Barriers to Discharge: No Barriers Identified   Patient Goals and CMS Choice Patient states their goals for this hospitalization and ongoing recovery are:: return home          Discharge Placement                       Discharge Plan and Services Additional resources added to the After Visit Summary for                                       Social Drivers of Health (SDOH) Interventions SDOH Screenings   Food Insecurity: No Food Insecurity (11/07/2023)  Housing: Low Risk  (11/07/2023)  Transportation Needs: No Transportation Needs (11/07/2023)  Utilities: Not At Risk (11/07/2023)  Financial Resource Strain: High Risk (07/03/2022)   Received from [facility]  Social Connections: Unknown (11/07/2023)  Tobacco Use: Low Risk  (11/07/2023)     Readmission Risk Interventions     No data to display

## 2023-11-08 NOTE — Progress Notes (Signed)
   Subjective: 1 Day Post-Op Procedure(s) (LRB): ARTHROPLASTY, HIP, TOTAL, ANTERIOR APPROACH (Left) Patient reports pain as mild.   Patient seen in rounds by Dr. Melodi. Patient is well, and has had no acute complaints or problems. Denies chest pain or SOB. No issues overnight.  We will continue therapy today  Objective: Vital signs in last 24 hours: Temp:  [97.3 F (36.3 C)-98.5 F (36.9 C)] 97.5 F (36.4 C) (11/06 0635) Pulse Rate:  [61-86] 68 (11/06 0635) Resp:  [12-21] 15 (11/06 0635) BP: (107-176)/(57-102) 128/65 (11/06 0635) SpO2:  [92 %-100 %] 99 % (11/06 0635) Weight:  [80 kg] 80 kg (11/05 0846)  Intake/Output from previous day:  Intake/Output Summary (Last 24 hours) at 11/08/2023 0734 Last data filed at 11/08/2023 0200 Gross per 24 hour  Intake 1802.5 ml  Output 250 ml  Net 1552.5 ml     Intake/Output this shift: No intake/output data recorded.  Labs: Recent Labs    11/08/23 0358  HGB 10.9*   Recent Labs    11/08/23 0358  WBC 10.2  RBC 3.45*  HCT 33.2*  PLT 175   Recent Labs    11/08/23 0358  NA 139  K 4.0  CL 109  CO2 21*  BUN 18  CREATININE 0.75  GLUCOSE 118*  CALCIUM  8.1*   No results for input(s): LABPT, INR in the last 72 hours.  Exam: General - Patient is Alert and Oriented Extremity - Neurologically intact Neurovascular intact Sensation intact distally Dorsiflexion/Plantar flexion intact Dressing - dressing C/D/I Motor Function - intact, moving foot and toes well on exam.   Past Medical History:  Diagnosis Date   Anemia    hx-before hysterectomy   Arthritis    Complication of anesthesia    woke up early once   Headache(784.0)    History of kidney stones    Hyperlipidemia    Hypertension    Intracranial hypertension    See note under Tumor of optic nerve.   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    Assessment/Plan: 1 Day Post-Op Procedure(s) (LRB): ARTHROPLASTY, HIP, TOTAL, ANTERIOR APPROACH (Left) Principal Problem:   OA (osteoarthritis) of hip  Estimated body mass index is 38.84 kg/m as calculated from the following:   Height as of this encounter: 4' 8.5 (1.435 m).   Weight as of this encounter: 80 kg. Advance diet Up with therapy D/C IV fluids  DVT Prophylaxis - Xarelto Weight bearing as tolerated. Continue therapy.  Plan is to go Home after hospital stay. Plan for discharge with HEP later today if progresses with therapy and meeting goals. Follow-up in the office in 2 weeks.  The PDMP database was reviewed today prior to any opioid medications being prescribed to this patient.  Roxie Mess, PA-C Orthopedic Surgery [phone] 11/08/2023, 7:34 AM
